# Patient Record
Sex: Male | Born: 1960 | Smoking: Never smoker
Health system: Southern US, Community
[De-identification: ages and names within clinical notes are randomized; demographics above are authoritative.]

## PROBLEM LIST (undated history)

## (undated) DIAGNOSIS — M1990 Unspecified osteoarthritis, unspecified site: Secondary | ICD-10-CM

## (undated) DIAGNOSIS — R079 Chest pain, unspecified: Secondary | ICD-10-CM

## (undated) DIAGNOSIS — K529 Noninfective gastroenteritis and colitis, unspecified: Secondary | ICD-10-CM

## (undated) DIAGNOSIS — M51369 Other intervertebral disc degeneration, lumbar region without mention of lumbar back pain or lower extremity pain: Secondary | ICD-10-CM

## (undated) DIAGNOSIS — M5126 Other intervertebral disc displacement, lumbar region: Secondary | ICD-10-CM

## (undated) DIAGNOSIS — M5136 Other intervertebral disc degeneration, lumbar region: Secondary | ICD-10-CM

## (undated) HISTORY — PX: OTHER SURGICAL HISTORY: SHX169

## (undated) HISTORY — PX: SHOULDER ARTHROSCOPY: SHX128

---

## 2014-06-22 ENCOUNTER — Other Ambulatory Visit (HOSPITAL_COMMUNITY): Payer: Self-pay | Admitting: Orthopaedic Surgery

## 2014-06-30 NOTE — Patient Instructions (Addendum)
YOUR PROCEDURE IS SCHEDULED ON :  07/07/14  REPORT TO [facility] MAIN ENTRANCE FOLLOW SIGNS TO EAST ELEVATOR - GO TO 3rd FLOOR CHECK IN AT 3 EAST NURSES STATION (SHORT STAY) AT:  7:45 AM  CALL THIS NUMBER IF YOU HAVE PROBLEMS THE MORNING OF SURGERY [phone]  REMEMBER:ONLY 1 PER PERSON MAY GO TO SHORT STAY WITH YOU TO GET READY THE MORNING OF YOUR SURGERY  DO NOT EAT FOOD OR DRINK LIQUIDS AFTER MIDNIGHT  TAKE THESE MEDICINES THE MORNING OF SURGERY: NONE  YOU MAY NOT HAVE ANY METAL ON YOUR BODY INCLUDING HAIR PINS AND PIERCING'S. DO NOT WEAR JEWELRY, MAKEUP, LOTIONS, POWDERS OR PERFUMES. DO NOT WEAR NAIL POLISH. DO NOT SHAVE 48 HRS PRIOR TO SURGERY. MEN MAY SHAVE FACE AND NECK.  DO NOT BRING VALUABLES TO HOSPITAL. [facility] IS NOT RESPONSIBLE FOR VALUABLES.  CONTACTS, DENTURES OR PARTIALS MAY NOT BE WORN TO SURGERY. LEAVE SUITCASE IN CAR. CAN BE BROUGHT TO ROOM AFTER SURGERY.  PATIENTS DISCHARGED THE DAY OF SURGERY WILL NOT BE ALLOWED TO DRIVE HOME.  PLEASE READ OVER THE FOLLOWING INSTRUCTION SHEETS _________________________________________________________________________________                                          [facility] - PREPARING FOR SURGERY  Before surgery, you can play an important role.  Because skin is not sterile, your skin needs to be as free of germs as possible.  You can reduce the number of germs on your skin by washing with CHG (chlorahexidine gluconate) soap before surgery.  CHG is an antiseptic cleaner which kills germs and bonds with the skin to continue killing germs even after washing. Please DO NOT use if you have an allergy to CHG or antibacterial soaps.  If your skin becomes reddened/irritated stop using the CHG and inform your nurse when you arrive at Short Stay. Do not shave (including legs and underarms) for at least 48 hours prior to the first CHG shower.  You may shave your face. Please follow these instructions  carefully:   1.  Shower with CHG Soap the night before surgery and the  morning of Surgery.   2.  If you choose to wash your hair, wash your hair first as usual with your  normal  Shampoo.   3.  After you shampoo, rinse your hair and body thoroughly to remove the  shampoo.                                         4.  Use CHG as you would any other liquid soap.  You can apply chg directly  to the skin and wash . Gently wash with scrungie or clean wascloth    5.  Apply the CHG Soap to your body ONLY FROM THE NECK DOWN.   Do not use on open                           Wound or open sores. Avoid contact with eyes, ears mouth and genitals (private parts).                        Genitals (private parts) with your normal soap.  6.  Wash thoroughly, paying special attention to the area where your surgery  will be performed.   7.  Thoroughly rinse your body with warm water from the neck down.   8.  DO NOT shower/wash with your normal soap after using and rinsing off  the CHG Soap .                9.  Pat yourself dry with a clean towel.             10.  Wear clean night clothes to bed after shower             11.  Place clean sheets on your bed the night of your first shower and do not  sleep with pets.  Day of Surgery : Do not apply any lotions/deodorants the morning of surgery.  Please wear clean clothes to the hospital/surgery center.  FAILURE TO FOLLOW THESE INSTRUCTIONS MAY RESULT IN THE CANCELLATION OF YOUR SURGERY    PATIENT SIGNATURE_________________________________  ______________________________________________________________________

## 2014-07-03 ENCOUNTER — Encounter (HOSPITAL_COMMUNITY)
Admission: RE | Admit: 2014-07-03 | Discharge: 2014-07-03 | Disposition: A | Discharge status: Home / Self Care | Payer: BLUE CROSS/BLUE SHIELD | Source: Ambulatory Visit | Attending: Orthopaedic Surgery | Admitting: Orthopaedic Surgery

## 2014-07-03 ENCOUNTER — Encounter (HOSPITAL_COMMUNITY): Payer: Self-pay

## 2014-07-03 DIAGNOSIS — R079 Chest pain, unspecified: Secondary | ICD-10-CM | POA: Insufficient documentation

## 2014-07-03 DIAGNOSIS — M1612 Unilateral primary osteoarthritis, left hip: Secondary | ICD-10-CM | POA: Insufficient documentation

## 2014-07-03 DIAGNOSIS — Z01810 Encounter for preprocedural cardiovascular examination: Secondary | ICD-10-CM | POA: Insufficient documentation

## 2014-07-03 HISTORY — DX: Other intervertebral disc displacement, lumbar region: M51.26

## 2014-07-03 HISTORY — DX: Unspecified osteoarthritis, unspecified site: M19.90

## 2014-07-03 HISTORY — DX: Other intervertebral disc degeneration, lumbar region without mention of lumbar back pain or lower extremity pain: M51.369

## 2014-07-03 HISTORY — DX: Other intervertebral disc degeneration, lumbar region: M51.36

## 2014-07-03 HISTORY — DX: Noninfective gastroenteritis and colitis, unspecified: K52.9

## 2014-07-03 HISTORY — DX: Chest pain, unspecified: R07.9

## 2014-07-03 LAB — BASIC METABOLIC PANEL
ANION GAP: 9 (ref 5–15)
BUN: 26 mg/dL — AB (ref 6–20)
CO2: 24 mmol/L (ref 22–32)
Calcium: 9.3 mg/dL (ref 8.9–10.3)
Chloride: 106 mmol/L (ref 101–111)
Creatinine, Ser: 1 mg/dL (ref 0.61–1.24)
Glucose, Bld: 93 mg/dL (ref 65–99)
POTASSIUM: 4.2 mmol/L (ref 3.5–5.1)
Sodium: 139 mmol/L (ref 135–145)

## 2014-07-03 LAB — SURGICAL PCR SCREEN
MRSA, PCR: NEGATIVE
STAPHYLOCOCCUS AUREUS: NEGATIVE

## 2014-07-03 LAB — CBC
HEMATOCRIT: 45.8 % (ref 39.0–52.0)
HEMOGLOBIN: 15 g/dL (ref 13.0–17.0)
MCH: 28.2 pg (ref 26.0–34.0)
MCHC: 32.8 g/dL (ref 30.0–36.0)
MCV: 86.1 fL (ref 78.0–100.0)
Platelets: 201 10*3/uL (ref 150–400)
RBC: 5.32 MIL/uL (ref 4.22–5.81)
RDW: 13.4 % (ref 11.5–15.5)
WBC: 9.4 10*3/uL (ref 4.0–10.5)

## 2014-07-03 LAB — PROTIME-INR
INR: 1.05 (ref 0.00–1.49)
PROTHROMBIN TIME: 13.9 s (ref 11.6–15.2)

## 2014-07-03 LAB — APTT: aPTT: 31 seconds (ref 24–37)

## 2014-07-03 NOTE — Progress Notes (Signed)
Due to pt concerns about recent intermittent chest pains in past 2 weeks an EKG was done and reviewed by Dr. Driscilla MoatsSinger-nephrologist- no further action was needed at this time - EKG was normal - pt was very vague about sx - denied nausea, sweats, dizziness, unsure of any radiation. Advised pt that EKG looked normal to Dr.Singer but I told pt if problems persisted to see a physician for further evaluation.

## 2014-07-03 NOTE — Progress Notes (Signed)
   07/03/14 1416  OBSTRUCTIVE SLEEP APNEA  Have you ever been diagnosed with sleep apnea through a sleep study? No  Do you snore loudly (loud enough to be heard through closed doors)?  1  Do you often feel tired, fatigued, or sleepy during the daytime? 1  Has anyone observed you stop breathing during your sleep? 0  Do you have, or are you being treated for high blood pressure? 0  BMI more than 35 kg/m2? 1  Age over 53 years old? 1  Neck circumference greater than 40 cm/16 inches? 1  Gender: 1

## 2014-07-06 MED ORDER — DEXTROSE 5 % IV SOLN
3.0000 g | INTRAVENOUS | Status: AC
  Administered 2014-07-07: 3 g via INTRAVENOUS
  Filled 2014-07-06: qty 3000

## 2014-07-07 ENCOUNTER — Inpatient Hospital Stay (HOSPITAL_COMMUNITY)
Admission: RE | Admit: 2014-07-07 | Discharge: 2014-07-08 | DRG: 470 | Disposition: A | Discharge status: Home Health | Payer: BLUE CROSS/BLUE SHIELD | Source: Ambulatory Visit | Attending: Orthopaedic Surgery | Admitting: Orthopaedic Surgery

## 2014-07-07 ENCOUNTER — Inpatient Hospital Stay (HOSPITAL_COMMUNITY): Payer: BLUE CROSS/BLUE SHIELD

## 2014-07-07 ENCOUNTER — Inpatient Hospital Stay (HOSPITAL_COMMUNITY): Payer: BLUE CROSS/BLUE SHIELD | Admitting: Registered Nurse

## 2014-07-07 ENCOUNTER — Encounter (HOSPITAL_COMMUNITY): Payer: Self-pay | Admitting: *Deleted

## 2014-07-07 ENCOUNTER — Encounter (HOSPITAL_COMMUNITY)
Admission: RE | Disposition: A | Discharge status: Home Health | Payer: Self-pay | Source: Ambulatory Visit | Attending: Orthopaedic Surgery

## 2014-07-07 DIAGNOSIS — M1612 Unilateral primary osteoarthritis, left hip: Secondary | ICD-10-CM | POA: Diagnosis present

## 2014-07-07 DIAGNOSIS — M25552 Pain in left hip: Secondary | ICD-10-CM | POA: Diagnosis present

## 2014-07-07 DIAGNOSIS — D62 Acute posthemorrhagic anemia: Secondary | ICD-10-CM | POA: Diagnosis not present

## 2014-07-07 DIAGNOSIS — Z419 Encounter for procedure for purposes other than remedying health state, unspecified: Secondary | ICD-10-CM

## 2014-07-07 DIAGNOSIS — Z96642 Presence of left artificial hip joint: Secondary | ICD-10-CM

## 2014-07-07 DIAGNOSIS — Z01812 Encounter for preprocedural laboratory examination: Secondary | ICD-10-CM

## 2014-07-07 HISTORY — PX: TOTAL HIP ARTHROPLASTY: SHX124

## 2014-07-07 LAB — TYPE AND SCREEN
ABO/RH(D): O POS
Antibody Screen: NEGATIVE

## 2014-07-07 LAB — ABO/RH: ABO/RH(D): O POS

## 2014-07-07 SURGERY — ARTHROPLASTY, HIP, TOTAL, ANTERIOR APPROACH
Anesthesia: Spinal | Site: Hip | Laterality: Left

## 2014-07-07 MED ORDER — PHENYLEPHRINE HCL 10 MG/ML IJ SOLN
10.0000 mg | INTRAVENOUS | Status: DC | PRN
  Administered 2014-07-07: 30 ug/min via INTRAVENOUS

## 2014-07-07 MED ORDER — FENTANYL CITRATE (PF) 100 MCG/2ML IJ SOLN
INTRAMUSCULAR | Status: AC
  Filled 2014-07-07: qty 2

## 2014-07-07 MED ORDER — MIDAZOLAM HCL 2 MG/2ML IJ SOLN
INTRAMUSCULAR | Status: AC
  Filled 2014-07-07: qty 2

## 2014-07-07 MED ORDER — LACTATED RINGERS IV SOLN: INTRAVENOUS | Status: DC

## 2014-07-07 MED ORDER — PROMETHAZINE HCL 25 MG/ML IJ SOLN: 6.2500 mg | INTRAMUSCULAR | Status: DC | PRN

## 2014-07-07 MED ORDER — PROPOFOL 10 MG/ML IV BOLUS
INTRAVENOUS | Status: AC
  Filled 2014-07-07: qty 20

## 2014-07-07 MED ORDER — PROPOFOL 10 MG/ML IV BOLUS
INTRAVENOUS | Status: AC
Start: 2014-07-07 — End: 2014-07-07
  Filled 2014-07-07: qty 20

## 2014-07-07 MED ORDER — DEXAMETHASONE SODIUM PHOSPHATE 10 MG/ML IJ SOLN
INTRAMUSCULAR | Status: AC
  Filled 2014-07-07: qty 1

## 2014-07-07 MED ORDER — ACETAMINOPHEN 650 MG RE SUPP: 650.0000 mg | Freq: Four times a day (QID) | RECTAL | Status: DC | PRN

## 2014-07-07 MED ORDER — HYDROMORPHONE HCL 1 MG/ML IJ SOLN
1.0000 mg | INTRAMUSCULAR | Status: DC | PRN
  Administered 2014-07-07 – 2014-07-08 (×2): 1 mg via INTRAVENOUS
  Filled 2014-07-07 (×2): qty 1

## 2014-07-07 MED ORDER — BUPIVACAINE HCL (PF) 0.5 % IJ SOLN
INTRAMUSCULAR | Status: AC
  Filled 2014-07-07: qty 30

## 2014-07-07 MED ORDER — ACETAMINOPHEN 10 MG/ML IV SOLN
1000.0000 mg | Freq: Once | INTRAVENOUS | Status: AC
  Administered 2014-07-07: 1000 mg via INTRAVENOUS

## 2014-07-07 MED ORDER — METOCLOPRAMIDE HCL 5 MG/ML IJ SOLN
5.0000 mg | Freq: Three times a day (TID) | INTRAMUSCULAR | Status: DC | PRN
Start: 2014-07-07 — End: 2014-07-08

## 2014-07-07 MED ORDER — PROPOFOL INFUSION 10 MG/ML OPTIME
INTRAVENOUS | Status: DC | PRN
  Administered 2014-07-07: 75 ug/kg/min via INTRAVENOUS

## 2014-07-07 MED ORDER — DEXAMETHASONE SODIUM PHOSPHATE 10 MG/ML IJ SOLN
INTRAMUSCULAR | Status: DC | PRN
  Administered 2014-07-07: 10 mg via INTRAVENOUS

## 2014-07-07 MED ORDER — MENTHOL 3 MG MT LOZG: 1.0000 | LOZENGE | OROMUCOSAL | Status: DC | PRN

## 2014-07-07 MED ORDER — PHENOL 1.4 % MT LIQD
1.0000 | OROMUCOSAL | Status: DC | PRN
  Filled 2014-07-07: qty 177

## 2014-07-07 MED ORDER — MIDAZOLAM HCL 5 MG/5ML IJ SOLN
INTRAMUSCULAR | Status: DC | PRN
  Administered 2014-07-07: 1 mg via INTRAVENOUS
  Administered 2014-07-07: 2 mg via INTRAVENOUS

## 2014-07-07 MED ORDER — ZOLPIDEM TARTRATE 5 MG PO TABS: 5.0000 mg | ORAL_TABLET | Freq: Every evening | ORAL | Status: DC | PRN

## 2014-07-07 MED ORDER — PHENYLEPHRINE HCL 10 MG/ML IJ SOLN
INTRAMUSCULAR | Status: AC
  Filled 2014-07-07: qty 1

## 2014-07-07 MED ORDER — ONDANSETRON HCL 4 MG/2ML IJ SOLN
INTRAMUSCULAR | Status: DC | PRN
  Administered 2014-07-07: 4 mg via INTRAVENOUS

## 2014-07-07 MED ORDER — PHENYLEPHRINE HCL 10 MG/ML IJ SOLN
INTRAMUSCULAR | Status: DC | PRN
  Administered 2014-07-07 (×2): 160 ug via INTRAVENOUS

## 2014-07-07 MED ORDER — ACETAMINOPHEN 10 MG/ML IV SOLN
INTRAVENOUS | Status: AC
  Filled 2014-07-07: qty 100

## 2014-07-07 MED ORDER — SODIUM CHLORIDE 0.9 % IR SOLN
Status: DC | PRN
  Administered 2014-07-07: 1000 mL

## 2014-07-07 MED ORDER — LACTATED RINGERS IV SOLN
INTRAVENOUS | Status: DC
  Administered 2014-07-07: 1000 mL via INTRAVENOUS

## 2014-07-07 MED ORDER — METOCLOPRAMIDE HCL 10 MG PO TABS: 5.0000 mg | ORAL_TABLET | Freq: Three times a day (TID) | ORAL | Status: DC | PRN

## 2014-07-07 MED ORDER — ONDANSETRON HCL 4 MG/2ML IJ SOLN
INTRAMUSCULAR | Status: AC
  Filled 2014-07-07: qty 2

## 2014-07-07 MED ORDER — FENTANYL CITRATE (PF) 100 MCG/2ML IJ SOLN
INTRAMUSCULAR | Status: DC | PRN
  Administered 2014-07-07: 100 ug via INTRAVENOUS

## 2014-07-07 MED ORDER — HYDROMORPHONE HCL 1 MG/ML IJ SOLN: 0.2500 mg | INTRAMUSCULAR | Status: DC | PRN

## 2014-07-07 MED ORDER — PROPOFOL 10 MG/ML IV BOLUS
INTRAVENOUS | Status: DC | PRN
  Administered 2014-07-07: 20 mg via INTRAVENOUS
  Administered 2014-07-07: 60 mg via INTRAVENOUS

## 2014-07-07 MED ORDER — DOCUSATE SODIUM 100 MG PO CAPS
100.0000 mg | ORAL_CAPSULE | Freq: Two times a day (BID) | ORAL | Status: DC
  Administered 2014-07-07 – 2014-07-08 (×2): 100 mg via ORAL

## 2014-07-07 MED ORDER — OXYCODONE HCL 5 MG PO TABS
5.0000 mg | ORAL_TABLET | ORAL | Status: DC | PRN
  Administered 2014-07-07 (×2): 5 mg via ORAL
  Filled 2014-07-07 (×2): qty 1

## 2014-07-07 MED ORDER — METHOCARBAMOL 1000 MG/10ML IJ SOLN
500.0000 mg | Freq: Four times a day (QID) | INTRAVENOUS | Status: DC | PRN
  Administered 2014-07-07: 500 mg via INTRAVENOUS
  Filled 2014-07-07 (×2): qty 5

## 2014-07-07 MED ORDER — ASPIRIN EC 325 MG PO TBEC
325.0000 mg | DELAYED_RELEASE_TABLET | Freq: Two times a day (BID) | ORAL | Status: DC
  Administered 2014-07-07 – 2014-07-08 (×2): 325 mg via ORAL
  Filled 2014-07-07 (×4): qty 1

## 2014-07-07 MED ORDER — SODIUM CHLORIDE 0.9 % IV SOLN
INTRAVENOUS | Status: DC
  Administered 2014-07-07: 15:00:00 via INTRAVENOUS

## 2014-07-07 MED ORDER — PHENYLEPHRINE 40 MCG/ML (10ML) SYRINGE FOR IV PUSH (FOR BLOOD PRESSURE SUPPORT)
PREFILLED_SYRINGE | INTRAVENOUS | Status: AC
  Filled 2014-07-07: qty 10

## 2014-07-07 MED ORDER — MEPERIDINE HCL 50 MG/ML IJ SOLN: 6.2500 mg | INTRAMUSCULAR | Status: DC | PRN

## 2014-07-07 MED ORDER — OXYCODONE HCL 5 MG PO TABS
5.0000 mg | ORAL_TABLET | ORAL | Status: DC | PRN
  Administered 2014-07-07 – 2014-07-08 (×4): 10 mg via ORAL
  Filled 2014-07-07 (×4): qty 2

## 2014-07-07 MED ORDER — 0.9 % SODIUM CHLORIDE (POUR BTL) OPTIME
TOPICAL | Status: DC | PRN
  Administered 2014-07-07: 1000 mL

## 2014-07-07 MED ORDER — LACTATED RINGERS IV SOLN
INTRAVENOUS | Status: DC | PRN
  Administered 2014-07-07 (×3): via INTRAVENOUS

## 2014-07-07 MED ORDER — ACETAMINOPHEN 325 MG PO TABS
650.0000 mg | ORAL_TABLET | Freq: Four times a day (QID) | ORAL | Status: DC | PRN
  Administered 2014-07-08: 650 mg via ORAL
  Filled 2014-07-07: qty 2

## 2014-07-07 MED ORDER — FENTANYL CITRATE (PF) 100 MCG/2ML IJ SOLN
25.0000 ug | INTRAMUSCULAR | Status: DC | PRN
  Administered 2014-07-07 (×3): 50 ug via INTRAVENOUS

## 2014-07-07 MED ORDER — ONDANSETRON HCL 4 MG/2ML IJ SOLN: 4.0000 mg | Freq: Four times a day (QID) | INTRAMUSCULAR | Status: DC | PRN

## 2014-07-07 MED ORDER — KETOROLAC TROMETHAMINE 15 MG/ML IJ SOLN
15.0000 mg | Freq: Four times a day (QID) | INTRAMUSCULAR | Status: DC
  Administered 2014-07-07 – 2014-07-08 (×4): 15 mg via INTRAVENOUS
  Filled 2014-07-07 (×7): qty 1

## 2014-07-07 MED ORDER — TRANEXAMIC ACID 1000 MG/10ML IV SOLN
1000.0000 mg | INTRAVENOUS | Status: AC
  Administered 2014-07-07: 1000 mg via INTRAVENOUS
  Filled 2014-07-07: qty 10

## 2014-07-07 MED ORDER — METHOCARBAMOL 500 MG PO TABS
500.0000 mg | ORAL_TABLET | Freq: Four times a day (QID) | ORAL | Status: DC | PRN
Start: 2014-07-07 — End: 2014-07-08
  Administered 2014-07-07 – 2014-07-08 (×3): 500 mg via ORAL
  Filled 2014-07-07 (×3): qty 1

## 2014-07-07 MED ORDER — CEFAZOLIN SODIUM-DEXTROSE 2-3 GM-% IV SOLR
2.0000 g | Freq: Four times a day (QID) | INTRAVENOUS | Status: AC
  Administered 2014-07-07 (×2): 2 g via INTRAVENOUS
  Filled 2014-07-07 (×2): qty 50

## 2014-07-07 MED ORDER — DIPHENHYDRAMINE HCL 12.5 MG/5ML PO ELIX: 12.5000 mg | ORAL_SOLUTION | ORAL | Status: DC | PRN

## 2014-07-07 MED ORDER — ALUM & MAG HYDROXIDE-SIMETH 200-200-20 MG/5ML PO SUSP: 30.0000 mL | ORAL | Status: DC | PRN

## 2014-07-07 MED ORDER — ONDANSETRON HCL 4 MG PO TABS: 4.0000 mg | ORAL_TABLET | Freq: Four times a day (QID) | ORAL | Status: DC | PRN

## 2014-07-07 MED ORDER — BUPIVACAINE HCL (PF) 0.5 % IJ SOLN
INTRAMUSCULAR | Status: DC | PRN
  Administered 2014-07-07: 3 mL

## 2014-07-07 SURGICAL SUPPLY — 43 items
BAG ZIPLOCK 12X15 (MISCELLANEOUS) IMPLANT
BLADE SAW SGTL 18X1.27X75 (BLADE) ×2 IMPLANT
BLADE SAW SGTL 18X1.27X75MM (BLADE) ×1
CAPT HIP TOTAL 2 ×3 IMPLANT
CELLS DAT CNTRL 66122 CELL SVR (MISCELLANEOUS) ×1 IMPLANT
COVER PERINEAL POST (MISCELLANEOUS) ×3 IMPLANT
DRAPE C-ARM 42X120 X-RAY (DRAPES) ×3 IMPLANT
DRAPE STERI IOBAN 125X83 (DRAPES) ×3 IMPLANT
DRAPE U-SHAPE 47X51 STRL (DRAPES) ×9 IMPLANT
DRSG AQUACEL AG ADV 3.5X10 (GAUZE/BANDAGES/DRESSINGS) ×3 IMPLANT
DURAPREP 26ML APPLICATOR (WOUND CARE) ×3 IMPLANT
ELECT BLADE TIP CTD 4 INCH (ELECTRODE) ×3 IMPLANT
ELECT REM PT RETURN 9FT ADLT (ELECTROSURGICAL) ×3
ELECTRODE REM PT RTRN 9FT ADLT (ELECTROSURGICAL) ×1 IMPLANT
FACESHIELD WRAPAROUND (MASK) ×12 IMPLANT
GAUZE XEROFORM 1X8 LF (GAUZE/BANDAGES/DRESSINGS) ×3 IMPLANT
GLOVE BIO SURGEON STRL SZ 6.5 (GLOVE) ×2 IMPLANT
GLOVE BIO SURGEON STRL SZ7.5 (GLOVE) ×6 IMPLANT
GLOVE BIO SURGEONS STRL SZ 6.5 (GLOVE) ×1
GLOVE BIOGEL PI IND STRL 6.5 (GLOVE) ×1 IMPLANT
GLOVE BIOGEL PI IND STRL 7.5 (GLOVE) ×1 IMPLANT
GLOVE BIOGEL PI IND STRL 8 (GLOVE) ×2 IMPLANT
GLOVE BIOGEL PI INDICATOR 6.5 (GLOVE) ×2
GLOVE BIOGEL PI INDICATOR 7.5 (GLOVE) ×2
GLOVE BIOGEL PI INDICATOR 8 (GLOVE) ×4
GLOVE ECLIPSE 8.0 STRL XLNG CF (GLOVE) ×3 IMPLANT
GOWN STRL REUS W/TWL LRG LVL3 (GOWN DISPOSABLE) ×3 IMPLANT
GOWN STRL REUS W/TWL XL LVL3 (GOWN DISPOSABLE) ×9 IMPLANT
HANDPIECE INTERPULSE COAX TIP (DISPOSABLE) ×2
KIT BASIN OR (CUSTOM PROCEDURE TRAY) ×3 IMPLANT
PACK TOTAL JOINT (CUSTOM PROCEDURE TRAY) ×3 IMPLANT
PEN SKIN MARKING BROAD (MISCELLANEOUS) ×3 IMPLANT
RTRCTR WOUND ALEXIS 18CM MED (MISCELLANEOUS) ×3
SET HNDPC FAN SPRY TIP SCT (DISPOSABLE) ×1 IMPLANT
STAPLER VISISTAT 35W (STAPLE) ×3 IMPLANT
SUT ETHIBOND NAB CT1 #1 30IN (SUTURE) ×3 IMPLANT
SUT VIC AB 0 CT1 36 (SUTURE) ×3 IMPLANT
SUT VIC AB 1 CT1 36 (SUTURE) ×3 IMPLANT
SUT VIC AB 2-0 CT1 27 (SUTURE) ×4
SUT VIC AB 2-0 CT1 TAPERPNT 27 (SUTURE) ×2 IMPLANT
TOWEL OR 17X26 10 PK STRL BLUE (TOWEL DISPOSABLE) ×3 IMPLANT
TOWEL OR NON WOVEN STRL DISP B (DISPOSABLE) ×3 IMPLANT
YANKAUER SUCT BULB TIP 10FT TU (MISCELLANEOUS) ×3 IMPLANT

## 2014-07-07 NOTE — Progress Notes (Signed)
Utilization review completed.  

## 2014-07-07 NOTE — Plan of Care (Signed)
Problem: Consults Goal: Diagnosis- Total Joint Replacement Primary Total Hip     

## 2014-07-07 NOTE — Evaluation (Signed)
Physical Therapy Evaluation Patient Details Name: Philip [name] MRN: [id number] DOB: 11/08/1960 Today's Date: 07/07/2014   History of Present Illness  L THR  Clinical Impression  Pt s/p L THR presents with decreased L LE strength/ROM and post op pain limiting functional mobility.  Pt should progress to dc home with family assist and HHPT follow up    Follow Up Recommendations Home health PT    Equipment Recommendations  Rolling walker with 5" wheels    Recommendations for Other Services OT consult     Precautions / Restrictions Precautions Precautions: Fall Restrictions Weight Bearing Restrictions: No Other Position/Activity Restrictions: WBAT      Mobility  Bed Mobility Overal bed mobility: Needs Assistance;+2 for physical assistance Bed Mobility: Supine to Sit;Sit to Supine     Supine to sit: Mod assist;+2 for physical assistance;HOB elevated Sit to supine: Mod assist;+2 for physical assistance   General bed mobility comments: cues for sequence and use of R LE to self assist; assist to manage LEs and to control trunk  Transfers Overall transfer level: Needs assistance Equipment used: Rolling walker (2 wheeled) Transfers: Sit to/from Stand Sit to Stand: Min assist;Mod assist;+2 physical assistance;From elevated surface         General transfer comment: cues for LE management and use of UEs to self assist  Ambulation/Gait Ambulation/Gait assistance: Min assist;Mod assist;+2 safety/equipment Ambulation Distance (Feet): 32 Feet Assistive device: Rolling walker (2 wheeled) Gait Pattern/deviations: Step-to pattern;Decreased step length - right;Decreased step length - left;Shuffle;Trunk flexed Gait velocity: decr   General Gait Details: cues for sequence, posture and position from [employer]  Stairs            Wheelchair Mobility    Modified Rankin (Stroke Patients Only)       Balance                                              Pertinent Vitals/Pain Pain Assessment: 0-10 Pain Score: 5  Pain Location: L hip Pain Descriptors / Indicators: Aching;Burning Pain Intervention(s): Limited activity within patient's tolerance;Monitored during session;Premedicated before session;Ice applied    Home Living Family/patient expects to be discharged to:: Private residence Living Arrangements: Spouse/significant other Available Help at Discharge: Family Type of Home: House Home Access: Stairs to enter Entrance Stairs-Rails: None Secretary/administratorntrance Stairs-Number of Steps: 1 Home Layout: One level Home Equipment: Cane - single point      Prior Function Level of Independence: Independent               Hand Dominance        Extremity/Trunk Assessment   Upper Extremity Assessment: Overall WFL for tasks assessed           Lower Extremity Assessment: LLE deficits/detail      Cervical / Trunk Assessment: Normal  Communication   Communication: No difficulties  Cognition Arousal/Alertness: Awake/alert Behavior During Therapy: WFL for tasks assessed/performed Overall Cognitive Status: Within Functional Limits for tasks assessed                      General Comments      Exercises Total Joint Exercises Ankle Circles/Pumps: AROM;Both;15 reps;Supine      Assessment/Plan    PT Assessment Patient needs continued PT services  PT Diagnosis Difficulty walking   PT Problem List Decreased strength;Decreased range of motion;Decreased activity tolerance;Decreased mobility;Decreased knowledge of use  of DME;Obesity;Pain  PT Treatment Interventions DME instruction;Gait training;Stair training;Functional mobility training;Therapeutic activities;Therapeutic exercise;Patient/family education   PT Goals (Current goals can be found in the Care Plan section) Acute Rehab PT Goals Patient Stated Goal: Resume previous lifestyle with decreased pain PT Goal Formulation: With patient Time For Goal Achievement:  07/14/14 Potential to Achieve Goals: Good    Frequency 7X/week   Barriers to discharge        Co-evaluation               End of Session Equipment Utilized During Treatment: Gait belt Activity Tolerance: Patient tolerated treatment well Patient left: in bed;with call bell/phone within reach;with family/visitor present Nurse Communication: Mobility status         Time: 1610-9604 PT Time Calculation (min) (ACUTE ONLY): 34 min   Charges:   PT Evaluation $Initial PT Evaluation Tier I: 1 Procedure PT Treatments $Gait Training: 8-22 mins   PT G Codes:        Philip [name] 07/14/14, 5:32 PM

## 2014-07-07 NOTE — Anesthesia Preprocedure Evaluation (Addendum)
Anesthesia Evaluation  Patient identified by MRN, date of birth, ID band Patient awake    Reviewed: Allergy & Precautions, NPO status , Patient's Chart, lab work & pertinent test results  Airway Mallampati: II  TM Distance: >3 FB Neck ROM: Full    Dental no notable dental hx.    Pulmonary neg pulmonary ROS,  breath sounds clear to auscultation  Pulmonary exam normal       Cardiovascular negative cardio ROS Normal cardiovascular examRhythm:Regular Rate:Normal     Neuro/Psych negative neurological ROS  negative psych ROS   GI/Hepatic negative GI ROS, Neg liver ROS,   Endo/Other  negative endocrine ROS  Renal/GU negative Renal ROS  negative genitourinary   Musculoskeletal negative musculoskeletal ROS (+)   Abdominal   Peds negative pediatric ROS (+)  Hematology negative hematology ROS (+)   Anesthesia Other Findings   Reproductive/Obstetrics negative OB ROS                             Anesthesia Physical Anesthesia Plan  ASA: II  Anesthesia Plan: Spinal   Post-op Pain Management:    Induction:   Airway Management Planned: Simple Face Mask  Additional Equipment:   Intra-op Plan:   Post-operative Plan:   Informed Consent: I have reviewed the patients History and Physical, chart, labs and discussed the procedure including the risks, benefits and alternatives for the proposed anesthesia with the patient or authorized representative who has indicated his/her understanding and acceptance.   Dental advisory given  Plan Discussed with: CRNA  Anesthesia Plan Comments:         Anesthesia Quick Evaluation  

## 2014-07-07 NOTE — Anesthesia Postprocedure Evaluation (Signed)
  Anesthesia Post-op Note  Patient: Philip [name]  Procedure(s) Performed: Procedure(s) (LRB): LEFT TOTAL HIP ARTHROPLASTY ANTERIOR APPROACH (Left)  Patient Location: PACU  Anesthesia Type: Spinal  Level of Consciousness: awake and alert   Airway and Oxygen Therapy: Patient Spontanous Breathing  Post-op Pain: mild  Post-op Assessment: Post-op Vital signs reviewed, Patient's Cardiovascular Status Stable, Respiratory Function Stable, Patent Airway and No signs of Nausea or vomiting  Last Vitals:  Filed Vitals:   07/07/14 1524  BP: 119/71  Pulse: 68  Temp: 36.5 C  Resp: 16    Post-op Vital Signs: stable   Complications: No apparent anesthesia complications

## 2014-07-07 NOTE — Brief Op Note (Signed)
07/07/2014  11:35 AM  PATIENT:  Philip [name]  54 y.o. male  PRE-OPERATIVE DIAGNOSIS:  osteoarthritis left hip  POST-OPERATIVE DIAGNOSIS:  osteoarthritis left hip  PROCEDURE:  Procedure(s): LEFT TOTAL HIP ARTHROPLASTY ANTERIOR APPROACH (Left)  SURGEON:  Surgeon(s) and Role:    * Kathryne Hitchhristopher Y Rubert Frediani, MD - Primary  PHYSICIAN ASSISTANT: Rexene EdisonGil Clark, PA-C  ANESTHESIA:   spinal  EBL:  Total I/O In: 1000 [I.V.:1000] Out: 300 [Blood:300]  BLOOD ADMINISTERED:none  DRAINS: none   LOCAL MEDICATIONS USED:  NONE  SPECIMEN:  No Specimen  DISPOSITION OF SPECIMEN:  N/A  COUNTS:  YES  TOURNIQUET:  * No tourniquets in log *  DICTATION: .Other Dictation: Dictation Number [id number]  PLAN OF CARE: Admit to inpatient   PATIENT DISPOSITION:  PACU - hemodynamically stable.   Delay start of Pharmacological VTE agent (>24hrs) due to surgical blood loss or risk of bleeding: no

## 2014-07-07 NOTE — H&P (Signed)
TOTAL HIP ADMISSION H&P  Patient is admitted for left total hip arthroplasty.  Subjective:  Chief Complaint: left hip pain  HPI: Philip [name], 54 y.o. male, has a history of pain and functional disability in the left hip(s) due to arthritis and patient has failed non-surgical conservative treatments for greater than 12 weeks to include NSAID's and/or analgesics, corticosteriod injections, flexibility and strengthening excercises, weight reduction as appropriate and activity modification.  Onset of symptoms was abrupt starting 1 years ago with rapidlly worsening course since that time.The patient noted no past surgery on the left hip(s).  Patient currently rates pain in the left hip at 9 out of 10 with activity. Patient has night pain, worsening of pain with activity and weight bearing, pain that interfers with activities of daily living and pain with passive range of motion. Patient has evidence of subchondral sclerosis, periarticular osteophytes and joint space narrowing by imaging studies. This condition presents safety issues increasing the risk of falls.  There is no current active infection.  Patient Active Problem List   Diagnosis Date Noted  . Osteoarthritis of left hip 07/07/2014   Past Medical History  Diagnosis Date  . Chest pain on exertion     past 2-3 weeks  . Arthritis   . Chronic diarrhea     several years  . Bulging lumbar disc     Past Surgical History  Procedure Laterality Date  . Fractured arm      as child    No prescriptions prior to admission   No Known Allergies  History  Substance Use Topics  . Smoking status: Never Smoker   . Smokeless tobacco: Not on file  . Alcohol Use: No    No family history on file.   Review of Systems  Musculoskeletal: Positive for back pain and joint pain.  All other systems reviewed and are negative.   Objective:  Physical Exam  Constitutional: He is oriented to person, place, and time. He appears well-developed and  well-nourished.  HENT:  Head: Normocephalic and atraumatic.  Eyes: Pupils are equal, round, and reactive to light.  Neck: Normal range of motion.  Cardiovascular: Normal rate.   Respiratory: Effort normal and breath sounds normal.  GI: Soft. Bowel sounds are normal.  Musculoskeletal:       Left hip: He exhibits decreased range of motion, decreased strength, tenderness and bony tenderness.  Neurological: He is alert and oriented to person, place, and time.  Skin: Skin is warm.  Psychiatric: He has a normal mood and affect.    Vital signs in last 24 hours:    Labs:   There is no height or weight on file to calculate BMI.   Imaging Review Plain radiographs demonstrate severe degenerative joint disease of the left hip(s). The bone quality appears to be good for age and reported activity level.  Assessment/Plan:  End stage arthritis, left hip(s)  The patient history, physical examination, clinical judgement of the provider and imaging studies are consistent with end stage degenerative joint disease of the left hip(s) and total hip arthroplasty is deemed medically necessary. The treatment options including medical management, injection therapy, arthroscopy and arthroplasty were discussed at length. The risks and benefits of total hip arthroplasty were presented and reviewed. The risks due to aseptic loosening, infection, stiffness, dislocation/subluxation,  thromboembolic complications and other imponderables were discussed.  The patient acknowledged the explanation, agreed to proceed with the plan and consent was signed. Patient is being admitted for inpatient treatment for surgery,  pain control, PT, OT, prophylactic antibiotics, VTE prophylaxis, progressive ambulation and ADL's and discharge planning.The patient is planning to be discharged home with home health services

## 2014-07-07 NOTE — Anesthesia Procedure Notes (Signed)
Spinal Patient location during procedure: OR End time: 07/07/2014 10:10 AM Staffing Resident/CRNA: Enrigue Catena E Performed by: anesthesiologist and resident/CRNA  Preanesthetic Checklist Completed: patient identified, site marked, surgical consent, pre-op evaluation, timeout performed, IV checked, risks and benefits discussed and monitors and equipment checked Spinal Block Patient position: sitting Prep: Betadine Patient monitoring: heart rate, continuous pulse ox and blood pressure Approach: right paramedian Location: L2-3 Injection technique: single-shot Needle Needle type: Spinocan and Sprotte  Needle gauge: 24 G Needle length: 9 cm Assessment Sensory level: T4 Additional Notes Expiration date of kit checked and confirmed. Patient tolerated procedure well, without complications. CSF return. Negative heme or paresthesia.

## 2014-07-07 NOTE — Transfer of Care (Signed)
Immediate Anesthesia Transfer of Care Note  Patient: Philip [name]  Procedure(s) Performed: Procedure(s): LEFT TOTAL HIP ARTHROPLASTY ANTERIOR APPROACH (Left)  Patient Location: PACU  Anesthesia Type:Spinal  Level of Consciousness: awake, alert  and patient cooperative  Airway & Oxygen Therapy: Patient Spontanous Breathing and Patient connected to face mask oxygen  Post-op Assessment: Report given to RN and Post -op Vital signs reviewed and stable  Post vital signs: stable  Last Vitals:  Filed Vitals:   07/07/14 0739  BP: 129/79  Pulse: 72  Temp: 36.6 C  Resp: 18    Complications: No apparent anesthesia complications

## 2014-07-08 LAB — BASIC METABOLIC PANEL
Anion gap: 8 (ref 5–15)
BUN: 17 mg/dL (ref 6–20)
CHLORIDE: 100 mmol/L — AB (ref 101–111)
CO2: 28 mmol/L (ref 22–32)
Calcium: 8.9 mg/dL (ref 8.9–10.3)
Creatinine, Ser: 0.7 mg/dL (ref 0.61–1.24)
GFR calc Af Amer: >60 mL/min (ref >60)
GFR calc non Af Amer: >60 mL/min (ref >60)
GLUCOSE: 147 mg/dL — AB (ref 65–99)
Potassium: 5.3 mmol/L — ABNORMAL HIGH (ref 3.5–5.1)
Sodium: 136 mmol/L (ref 135–145)

## 2014-07-08 LAB — CBC
HCT: 43.4 % (ref 39.0–52.0)
Hemoglobin: 14.4 g/dL (ref 13.0–17.0)
MCH: 29.3 pg (ref 26.0–34.0)
MCHC: 33.2 g/dL (ref 30.0–36.0)
MCV: 88.2 fL (ref 78.0–100.0)
Platelets: 187 10*3/uL (ref 150–400)
RBC: 4.92 MIL/uL (ref 4.22–5.81)
RDW: 13.6 % (ref 11.5–15.5)
WBC: 17.3 10*3/uL — ABNORMAL HIGH (ref 4.0–10.5)

## 2014-07-08 MED ORDER — OXYCODONE-ACETAMINOPHEN 5-325 MG PO TABS: 1.0000 | ORAL_TABLET | ORAL | Status: DC | PRN

## 2014-07-08 MED ORDER — ASPIRIN 325 MG PO TBEC: 325.0000 mg | DELAYED_RELEASE_TABLET | Freq: Two times a day (BID) | ORAL | Status: DC

## 2014-07-08 MED ORDER — METHOCARBAMOL 500 MG PO TABS: 500.0000 mg | ORAL_TABLET | Freq: Four times a day (QID) | ORAL | Status: DC | PRN

## 2014-07-08 NOTE — Plan of Care (Signed)
Problem: Consults Goal: Diagnosis- Total Joint Replacement Outcome: Completed/Met Date Met:  07/08/14 Primary Total Hip LEFT, Anterior  Problem: Phase III Progression Outcomes Goal: Anticoagulant follow-up in place Outcome: Not Applicable Date Met:  72/62/03 ASA for VTE, no f/u needed.

## 2014-07-08 NOTE — Progress Notes (Signed)
Physical Therapy Treatment Patient Details Name: Alphonse GuildHerman A Monterrosa MRN: [id number] DOB: 09/24/1960 Today's Date: 07/08/2014    History of Present Illness L THR    PT Comments    Marked improvement in activity tolerance and quality of mobility.  Pt and spouse asking about dc home today.  Follow Up Recommendations  Home health PT     Equipment Recommendations  Rolling walker with 5" wheels    Recommendations for Other Services OT consult     Precautions / Restrictions Precautions Precautions: Fall Restrictions Weight Bearing Restrictions: No Other Position/Activity Restrictions: WBAT    Mobility  Bed Mobility Overal bed mobility: Needs Assistance;+2 for physical assistance Bed Mobility: Supine to Sit     Supine to sit: Min assist     General bed mobility comments: cues for sequence and use of R LE to self assist  Transfers Overall transfer level: Needs assistance Equipment used: Rolling walker (2 wheeled) Transfers: Sit to/from Stand Sit to Stand: Min assist;From elevated surface         General transfer comment: cues for LE management and use of UEs to self assist  Ambulation/Gait Ambulation/Gait assistance: Min assist;Min guard Ambulation Distance (Feet): 123 Feet Assistive device: Rolling walker (2 wheeled) Gait Pattern/deviations: Step-to pattern;Decreased step length - right;Decreased step length - left;Shuffle;Trunk flexed Gait velocity: decr   General Gait Details: cues for sequence, posture and position from [employer]   Stairs            Wheelchair Mobility    Modified Rankin (Stroke Patients Only)       Balance                                    Cognition Arousal/Alertness: Awake/alert Behavior During Therapy: WFL for tasks assessed/performed Overall Cognitive Status: Within Functional Limits for tasks assessed                      Exercises Total Joint Exercises Ankle Circles/Pumps: AROM;Both;15  reps;Supine Quad Sets: AROM;Both;10 reps;Supine Heel Slides: AAROM;Left;20 reps;Supine Hip ABduction/ADduction: AAROM;Left;15 reps;Supine    General Comments        Pertinent Vitals/Pain Pain Assessment: 0-10 Pain Score: 4  Pain Location: L hip Pain Descriptors / Indicators: Aching;Burning Pain Intervention(s): Limited activity within patient's tolerance;Monitored during session;Premedicated before session;Ice applied    Home Living                      Prior Function            PT Goals (current goals can now be found in the care plan section) Acute Rehab PT Goals Patient Stated Goal: Resume previous lifestyle with decreased pain PT Goal Formulation: With patient Time For Goal Achievement: 07/14/14 Potential to Achieve Goals: Good Progress towards PT goals: Progressing toward goals    Frequency  7X/week    PT Plan Current plan remains appropriate    Co-evaluation             End of Session Equipment Utilized During Treatment: Gait belt Activity Tolerance: Patient tolerated treatment well Patient left: in chair;with call bell/phone within reach;with family/visitor present     Time: 2440-10270947-1021 PT Time Calculation (min) (ACUTE ONLY): 34 min  Charges:  $Gait Training: 8-22 mins $Therapeutic Exercise: 8-22 mins                    G Codes:  Aynsley Fleet 07/08/2014, 12:14 PM

## 2014-07-08 NOTE — Op Note (Signed)
NAMEJAKORIAN, MARENGO               ACCOUNT NO.:  [id number]  MEDICAL RECORD NO.:  [id number]  LOCATION:  1607                         FACILITY:  [facility]  PHYSICIAN:  Vanita Panda. Magnus Ivan, M.D.DATE OF BIRTH:  1960-02-20  DATE OF PROCEDURE:  07/07/2014 DATE OF DISCHARGE:                              OPERATIVE REPORT   PREOPERATIVE DIAGNOSES:  Primary osteoarthritis and degenerative joint disease of left hip.  POSTOPERATIVE DIAGNOSES:  Primary osteoarthritis and degenerative joint disease of left hip.  PROCEDURE:  Left total hip arthroplasty through direct anterior approach.  IMPLANTS:  DePuy Sector Gription acetabular component size 56, size 36+ 4 neutral polyethylene liner, size 14 Corail femoral component with standard offset, size 36+ 1.5 ceramic hip ball.  SURGEON:  Vanita Panda. Magnus Ivan, M.D.  ASSISTANT:  Richardean Canal, PA-C.  ANESTHESIA:  Spinal.  ANTIBIOTICS:  2 g of IV Ancef.  BLOOD LOSS:  About 500 mL.  COMPLICATIONS:  None.  INDICATIONS:  Mr. Cedillos is a 54 year old healthy individual with debilitating arthritis that is quite severe involving his left hip.  It has gotten to where it has affected his activities of daily living, his quality of life, and his mobility.  He has tried intra-articular injections in his hip that did help some and has gotten to where he has pain daily.  He understands the surgery, the risks of acute blood loss anemia, nerve and vessel injury, fracture, infection, dislocation, and DVT.  He understands the goals are decreased pain, improved mobility, and overall improved quality of life.  DESCRIPTION OF PROCEDURE:  After informed consent was obtained, appropriate left hip was marked.  He was brought to the operating room and spinal anesthesia was obtained while he was on the stretcher.  He was then laid in a supine position on the stretcher.  Traction boots were placed on both his feet.  Next, he was placed supine on the  HANA fracture table with the perineal post in place and both legs in inline skeletal traction devices, but no traction applied.  His left operative hip was then prepped and draped with DuraPrep and sterile drapes.  A time-out was called, he was identified as correct patient, correct left hip.  We then made an incision inferior and posterior to the anterosuperior iliac spine and carried this obliquely down the leg.  We dissected down the tensor fascia lata muscle and the tensor fascia lata was then divided longitudinally, so we could proceed with a direct anterior approach to the hip.  We identified and cauterized the lateral femoral circumflex vessels and then put a Cobra retractor on the medial and lateral femoral neck.  We opened up the hip capsule in a L-type format finding a moderate joint effusion and periarticular osteophytes. We placed Cobra retractors within the hip capsule and then made our femoral neck cut with an oscillating saw proximal to the lesser trochanter and completed this with an osteotome.  I placed a corkscrew guide in the femoral head, removed the femoral head in its entirety and found it to be devoid of cartilage.  I then placed a bent Hohmann of the medial acetabular rim and cleaned the acetabulum of remnants from acetabular labrum.  We  then began reaming under direct visualization from a size 42 reamer all the way up to size 56, with all reamers under direct visualization and the last 2 reamers under direct fluoroscopy, so we could obtain our depth of reaming, our inclination and anteversion. Once we were pleased with this, we placed the real DePuy Sector Gription acetabular component size 52, and a real 36+ 4 neutral polyethylene liner.  Attention was then turned to the femur.  With the leg externally rotated to 100 degrees, extended, and adducted, we were able to place a Mueller retractor medially and a Hohmann retractor behind the greater trochanter.  We  released the lateral joint capsule and piriformis and I used a box cutting osteotome to enter the femoral canal and a rongeur to lateralize.  We then began broaching from a size 8 broach using the Corail broaching system all the way up to a size 14.  With the 14, we trialed a standard neck and a 36+ 1 hip ball, we brought the leg back up and over and with traction and internal rotation, reduced the pelvis. We were pleased with leg lengths, stability, and offset.  We then dislocated the hip and removed the trial components.  We placed the real Corail femoral component size 14 and the real 36+ 1.5 ceramic hip ball and reduced this in the acetabulum, again it was stable.  We then copiously irrigated the soft tissues with normal saline solution using pulsatile lavage and I was able to get 1 suture in the joint capsule and closed it with #1 Ethibond followed by running #1 Vicryl in the tensor fascia, 0-Vicryl in the deep tissue, 2-0 Vicryl in the subcutaneous tissue, and staples on the skin.  An Aquacel dressing was applied.  He was then taken off the HANA table and taken to the recovery room in stable condition.  All final counts were correct.  There were no complications noted.  Of note, Richardean CanalGilbert Clark, PA-C assisted in the entire case and his assistance was crucial in facilitating all aspects of this case.     Vanita Pandahristopher Y. Magnus IvanBlackman, M.D.     CYB/MEDQ  D:  07/07/2014  T:  07/08/2014  Job:  [id number]

## 2014-07-08 NOTE — Progress Notes (Signed)
   Subjective:  Patient reports pain as mild.    Objective:   VITALS:   Filed Vitals:   07/07/14 1930 07/07/14 2200 07/08/14 0200 07/08/14 0600  BP: 107/55 106/58 94/62 114/53  Pulse: 78 72 58 63  Temp: 98.4 F (36.9 C) 98 F (36.7 C) 98.4 F (36.9 C) 97.9 F (36.6 C)  TempSrc: Oral Oral Oral Oral  Resp: 16 16 16 16   Height:      Weight:      SpO2: 98% 95% 97% 98%    Neurologically intact ABD soft Neurovascular intact Sensation intact distally Intact pulses distally Dorsiflexion/Plantar flexion intact Incision: dressing C/D/I and no drainage No cellulitis present Compartment soft   Lab Results  Component Value Date   WBC 17.3* 07/08/2014   HGB 14.4 07/08/2014   HCT 43.4 07/08/2014   MCV 88.2 07/08/2014   PLT 187 07/08/2014     Assessment/Plan:  1 Day Post-Op   - Expected postop acute blood loss anemia - will monitor for symptoms - Up with PT/OT - DVT ppx - SCDs, ambulation, asa - WBAT left lower extremity - Pain control - Discharge planning  Philip [name], Philip [name] 07/08/2014, 10:36 AM [phone]

## 2014-07-08 NOTE — Plan of Care (Signed)
Problem: Discharge Progression Outcomes Goal: Anticoagulant follow-up in place Outcome: Not Applicable Date Met:  88/32/54 asa

## 2014-07-08 NOTE — Discharge Summary (Signed)
Patient ID: Philip [name] MRN: [id number] DOB/AGE: 53/08/1960 53 y.o.  Admit date: 07/07/2014 Discharge date: 07/08/2014  Admission Diagnoses:  Principal Problem:   Osteoarthritis of left hip Active Problems:   Status post total replacement of left hip   Discharge Diagnoses:  Same  Past Medical History  Diagnosis Date  . Chest pain on exertion     past 2-3 weeks  . Arthritis   . Chronic diarrhea     several years  . Bulging lumbar disc     Surgeries: Procedure(s): LEFT TOTAL HIP ARTHROPLASTY ANTERIOR APPROACH on 07/07/2014   Consultants:    Discharged Condition: Improved  Hospital Course: Philip [name] is an 54 y.o. male who was admitted 07/07/2014 for operative treatment ofOsteoarthritis of left hip. Patient has severe unremitting pain that affects sleep, daily activities, and work/hobbies. After pre-op clearance the patient was taken to the operating room on 07/07/2014 and underwent  Procedure(s): LEFT TOTAL HIP ARTHROPLASTY ANTERIOR APPROACH.    Patient was given perioperative antibiotics: Anti-infectives    Start     Dose/Rate Route Frequency Ordered Stop   07/07/14 1600  ceFAZolin (ANCEF) IVPB 2 g/50 mL premix     2 g 100 mL/hr over 30 Minutes Intravenous Every 6 hours 07/07/14 1327 07/07/14 2335   07/07/14 0600  ceFAZolin (ANCEF) 3 g in dextrose 5 % 50 mL IVPB     3 g 160 mL/hr over 30 Minutes Intravenous On call to O.R. 07/06/14 1304 07/07/14 0955       Patient was given sequential compression devices, early ambulation, and chemoprophylaxis to prevent DVT.  Patient benefited maximally from hospital stay and there were no complications.    Recent vital signs: Patient Vitals for the past 24 hrs:  BP Temp Temp src Pulse Resp SpO2  07/08/14 0600 (!) 114/53 mmHg 97.9 F (36.6 C) Oral 63 16 98 %  07/08/14 0200 94/62 mmHg 98.4 F (36.9 C) Oral (!) 58 16 97 %  07/07/14 2200 (!) 106/58 mmHg 98 F (36.7 C) Oral 72 16 95 %  07/07/14 1930 (!) 107/55 mmHg 98.4 F (36.9  C) Oral 78 16 98 %  07/07/14 1619 (!) 116/94 mmHg 98.5 F (36.9 C) Oral - 16 98 %  07/07/14 1524 119/71 mmHg 97.7 F (36.5 C) Oral 68 16 99 %  07/07/14 1419 112/75 mmHg 97.9 F (36.6 C) Oral 61 14 99 %  07/07/14 1323 112/67 mmHg 98 F (36.7 C) - 69 12 100 %     Recent laboratory studies:  Recent Labs  07/08/14 0445  WBC 17.3*  HGB 14.4  HCT 43.4  PLT 187  NA 136  K 5.3*  CL 100*  CO2 28  BUN 17  CREATININE 0.70  GLUCOSE 147*  CALCIUM 8.9     Discharge Medications:     Medication List    STOP taking these medications        celecoxib 200 MG capsule  Commonly known as:  CELEBREX     ibuprofen 800 MG tablet  Commonly known as:  ADVIL,MOTRIN     loperamide 2 MG tablet  Commonly known as:  IMODIUM A-D      TAKE these medications        aspirin 325 MG EC tablet  Take 1 tablet (325 mg total) by mouth 2 (two) times daily after a meal.     methocarbamol 500 MG tablet  Commonly known as:  ROBAXIN  Take 1 tablet (500 mg total) by mouth every 6 (  six) hours as needed for muscle spasms.     oxyCODONE-acetaminophen 5-325 MG per tablet  Commonly known as:  ROXICET  Take 1-2 tablets by mouth every 4 (four) hours as needed.        Diagnostic Studies: Dg C-arm 61-120 Min-no Report  07/07/2014   CLINICAL DATA: left anterior hip   C-ARM 61-120 MINUTES  Fluoroscopy was utilized by the requesting physician.  No radiographic  interpretation.    Dg Hip Unilat With Pelvis 1v Left  07/07/2014   CLINICAL DATA:  Left hip.  EXAM: LEFT HIP (WITH PELVIS) 1 VIEW; DG C-ARM 61-120 MIN - NRPT [facility]  COMPARISON:  None.  FINDINGS: Multiple AP C-arm images demonstrate the patient has undergone left total hip prosthesis insertion. The components appear in excellent position in the AP projection. No fractures.  IMPRESSION: Left total hip prosthesis inserted. Satisfactory appearance in the AP projection.   Electronically Signed   By: Francene Boyers M.D.   On: 07/07/2014 11:57   Dg Hip Port  Unilat With Pelvis 1v Left  07/07/2014   CLINICAL DATA:  Status post left hip replacement.  EXAM: LEFT HIP (WITH PELVIS) 1 VIEW PORTABLE  COMPARISON:  None.  FINDINGS: Femoral and acetabular components of the total hip arthroplasty are well-seated and aligned. There is no acute fracture or evidence of an operative complication.  IMPRESSION: Well aligned left hip prosthesis.   Electronically Signed   By: Amie Portland M.D.   On: 07/07/2014 12:58    Disposition:  TO HOME      Discharge Instructions    Discharge patient    Complete by:  As directed            Follow-up Information    Follow up with Kathryne Hitch, MD In 2 weeks.   Specialty:  Orthopedic Surgery   Contact information:   [address] [place] [address] [phone]        Signed: Kathryne Hitch 07/08/2014, 1:15 PM

## 2014-07-08 NOTE — Progress Notes (Signed)
Physical Therapy Treatment Patient Details Name: Philip [name] MRN: [id number] DOB: 02-22-1960 Today's Date: 07/08/2014    History of Present Illness L THR    PT Comments    Pt progressing well with mobility and eager for dc home.  Reviewed car transfers and stairs with pt and spouse.  Follow Up Recommendations  Home health PT     Equipment Recommendations  Rolling walker with 5" wheels    Recommendations for Other Services OT consult     Precautions / Restrictions Precautions Precautions: Fall Restrictions Weight Bearing Restrictions: No Other Position/Activity Restrictions: WBAT    Mobility  Bed Mobility Overal bed mobility: Needs Assistance Bed Mobility: Sit to Supine       Sit to supine: Min guard   General bed mobility comments: cues for sequence and use of R LE to self assist  Transfers Overall transfer level: Needs assistance Equipment used: Rolling walker (2 wheeled) Transfers: Sit to/from Stand Sit to Stand: Min guard         General transfer comment: cues for LE management and use of UEs to self assist  Ambulation/Gait Ambulation/Gait assistance: Min guard;Supervision Ambulation Distance (Feet): 150 Feet Assistive device: Rolling walker (2 wheeled) Gait Pattern/deviations: Step-to pattern;Step-through pattern;Decreased step length - right;Decreased step length - left;Shuffle;Trunk flexed Gait velocity: decr   General Gait Details: cues for posture, position from RW and initial sequence   Stairs Stairs: Yes Stairs assistance: Min assist Stair Management: No rails;Step to pattern;Forwards;With walker Number of Stairs: 2 (single step twice) General stair comments: cues for sequence and foot/RW placement  Wheelchair Mobility    Modified Rankin (Stroke Patients Only)       Balance                                    Cognition Arousal/Alertness: Awake/alert Behavior During Therapy: WFL for tasks  assessed/performed Overall Cognitive Status: Within Functional Limits for tasks assessed                      Exercises      General Comments        Pertinent Vitals/Pain Pain Assessment: 0-10 Pain Score: 3  Pain Location: L hip Pain Descriptors / Indicators: Aching;Burning Pain Intervention(s): Limited activity within patient's tolerance;Monitored during session;Premedicated before session;Ice applied    Home Living Family/patient expects to be discharged to:: Private residence Living Arrangements: Spouse/significant other Available Help at Discharge: Family Type of Home: House Home Access: Stairs to enter Entrance Stairs-Rails: None Home Layout: One level Home Equipment: Cane - single point;Grab bars - tub/shower (elevated toilet seat)      Prior Function Level of Independence: Independent          PT Goals (current goals can now be found in the care plan section) Acute Rehab PT Goals Patient Stated Goal: Resume previous lifestyle with decreased pain PT Goal Formulation: With patient Time For Goal Achievement: 07/14/14 Potential to Achieve Goals: Good Progress towards PT goals: Progressing toward goals    Frequency  7X/week    PT Plan Current plan remains appropriate    Co-evaluation             End of Session Equipment Utilized During Treatment: Gait belt Activity Tolerance: Patient tolerated treatment well Patient left: in bed;with call bell/phone within reach;with family/visitor present     Time: 5621-3086 PT Time Calculation (min) (ACUTE ONLY): 27 min  Charges:  $  Gait Training: 8-22 mins $Therapeutic Activity: 8-22 mins                    G Codes:      Anush Wiedeman 07/08/2014, 3:21 PM

## 2014-07-08 NOTE — Care Management Note (Signed)
Case Management Note  Patient Details  Name: Philip [name] MRN: [id number] Date of Birth: 09/07/1960  Subjective/Objective:         L THR           Action/Plan: Home with Home Health  Expected Discharge Date:         07/08/14         Expected Discharge Plan:  Home w Home Health Services  In-House Referral:     Discharge planning Services  CM Consult  Post Acute Care Choice:    Choice offered to:  Patient, Spouse  DME Arranged:  Walker rolling DME Agency:     HH Arranged:  PT HH Agency:  [facility]  Status of Service:  Completed, signed off  Medicare Important Message Given:    Date Medicare IM Given:    Medicare IM give by:    Date Additional Medicare IM Given:    Additional Medicare Important Message give by:     If discussed at Long Length of Stay Meetings, dates discussed:    Additional Comments:  Governor Speckingdekunle, Nevaya Nagele Bimbo, RN 07/08/2014, 3:30 PM

## 2014-07-08 NOTE — Progress Notes (Signed)
Pt discharged from floor via w/c, belongings & family with pt. No changes in assessment. Philip [name], [name]

## 2014-07-08 NOTE — Progress Notes (Signed)
CM spoke with patient and family in room, [facility].  [facility] [phone] to arrange HHPT and Fayrene FearingJames at [phone] to arrange DME : walker

## 2014-07-08 NOTE — Discharge Instructions (Signed)
INSTRUCTIONS AFTER JOINT REPLACEMENT  ° °o Remove items at home which could result in a fall. This includes throw rugs or furniture in walking pathways °o ICE to the affected joint every three hours while awake for 30 minutes at a time, for at least the first 3-5 days, and then as needed for pain and swelling.  Continue to use ice for pain and swelling. You may notice swelling that will progress down to the foot and ankle.  This is normal after surgery.  Elevate your leg when you are not up walking on it.   °o Continue to use the breathing machine you got in the hospital (incentive spirometer) which will help keep your temperature down.  It is common for your temperature to cycle up and down following surgery, especially at night when you are not up moving around and exerting yourself.  The breathing machine keeps your lungs expanded and your temperature down. ° ° °DIET:  As you were doing prior to hospitalization, we recommend a well-balanced diet. ° °DRESSING / WOUND CARE / SHOWERING ° °Keep the surgical dressing until follow up.  The dressing is water proof, so you can shower without any extra covering.  IF THE DRESSING FALLS OFF or the wound gets wet inside, change the dressing with sterile gauze.  Please use good hand washing techniques before changing the dressing.  Do not use any lotions or creams on the incision until instructed by your surgeon.   ° °ACTIVITY ° °o Increase activity slowly as tolerated, but follow the weight bearing instructions below.   °o No driving for 6 weeks or until further direction given by your physician.  You cannot drive while taking narcotics.  °o No lifting or carrying greater than 10 lbs. until further directed by your surgeon. °o Avoid periods of inactivity such as sitting longer than an hour when not asleep. This helps prevent blood clots.  °o You may return to work once you are authorized by your doctor.  ° ° ° °WEIGHT BEARING  ° °Weight bearing as tolerated with assist  device (walker, cane, etc) as directed, use it as long as suggested by your surgeon or therapist, typically at least 4-6 weeks. ° ° °EXERCISES ° °Results after joint replacement surgery are often greatly improved when you follow the exercise, range of motion and muscle strengthening exercises prescribed by your doctor. Safety measures are also important to protect the joint from further injury. Any time any of these exercises cause you to have increased pain or swelling, decrease what you are doing until you are comfortable again and then slowly increase them. If you have problems or questions, call your caregiver or physical therapist for advice.  ° °Rehabilitation is important following a joint replacement. After just a few days of immobilization, the muscles of the leg can become weakened and shrink (atrophy).  These exercises are designed to build up the tone and strength of the thigh and leg muscles and to improve motion. Often times heat used for twenty to thirty minutes before working out will loosen up your tissues and help with improving the range of motion but do not use heat for the first two weeks following surgery (sometimes heat can increase post-operative swelling).  ° °These exercises can be done on a training (exercise) mat, on the floor, on a table or on a bed. Use whatever works the best and is most comfortable for you.    Use music or television while you are exercising so that   the exercises are a pleasant break in your day. This will make your life better with the exercises acting as a break in your routine that you can look forward to.   Perform all exercises about fifteen times, three times per day or as directed.  You should exercise both the operative leg and the other leg as well. ° °Exercises include: °  °• Quad Sets - Tighten up the muscle on the front of the thigh (Quad) and hold for 5-10 seconds.   °• Straight Leg Raises - With your knee straight (if you were given a brace, keep it on),  lift the leg to 60 degrees, hold for 3 seconds, and slowly lower the leg.  Perform this exercise against resistance later as your leg gets stronger.  °• Leg Slides: Lying on your back, slowly slide your foot toward your buttocks, bending your knee up off the floor (only go as far as is comfortable). Then slowly slide your foot back down until your leg is flat on the floor again.  °• Angel Wings: Lying on your back spread your legs to the side as far apart as you can without causing discomfort.  °• Hamstring Strength:  Lying on your back, push your heel against the floor with your leg straight by tightening up the muscles of your buttocks.  Repeat, but this time bend your knee to a comfortable angle, and push your heel against the floor.  You may put a pillow under the heel to make it more comfortable if necessary.  ° °A rehabilitation program following joint replacement surgery can speed recovery and prevent re-injury in the future due to weakened muscles. Contact your doctor or a physical therapist for more information on knee rehabilitation.  ° ° °CONSTIPATION ° °Constipation is defined medically as fewer than three stools per week and severe constipation as less than one stool per week.  Even if you have a regular bowel pattern at home, your normal regimen is likely to be disrupted due to multiple reasons following surgery.  Combination of anesthesia, postoperative narcotics, change in appetite and fluid intake all can affect your bowels.  ° °YOU MUST use at least one of the following options; they are listed in order of increasing strength to get the job done.  They are all available over the counter, and you may need to use some, POSSIBLY even all of these options:   ° °Drink plenty of fluids (prune juice may be helpful) and high fiber foods °Colace 100 mg by mouth twice a day  °Senokot for constipation as directed and as needed Dulcolax (bisacodyl), take with full glass of water  °Miralax (polyethylene glycol)  once or twice a day as needed. ° °If you have tried all these things and are unable to have a bowel movement in the first 3-4 days after surgery call either your surgeon or your primary doctor.   ° °If you experience loose stools or diarrhea, hold the medications until you stool forms back up.  If your symptoms do not get better within 1 week or if they get worse, check with your doctor.  If you experience "the worst abdominal pain ever" or develop nausea or vomiting, please contact the office immediately for further recommendations for treatment. ° ° °ITCHING:  If you experience itching with your medications, try taking only a single pain pill, or even half a pain pill at a time.  You can also use Benadryl over the counter for itching or also to   help with sleep.   TED HOSE STOCKINGS:  Use stockings on both legs until for at least 2 weeks or as directed by physician office. They may be removed at night for sleeping.  MEDICATIONS:  See your medication summary on the After Visit Summary that nursing will review with you.  You may have some home medications which will be placed on hold until you complete the course of blood thinner medication.  It is important for you to complete the blood thinner medication as prescribed.  PRECAUTIONS:  If you experience chest pain or shortness of breath - call 911 immediately for transfer to the hospital emergency department.   If you develop a fever greater that 101 F, purulent drainage from wound, increased redness or drainage from wound, foul odor from the wound/dressing, or calf pain - CONTACT YOUR SURGEON.                                                   FOLLOW-UP APPOINTMENTS:  If you do not already have a post-op appointment, please call the office for an appointment to be seen by your surgeon.  Guidelines for how soon to be seen are listed in your After Visit Summary, but are typically between 1-4 weeks after surgery.  OTHER INSTRUCTIONS:   Knee  Replacement:  Do not place pillow under knee, focus on keeping the knee straight while resting. CPM instructions: 0-90 degrees, 2 hours in the morning, 2 hours in the afternoon, and 2 hours in the evening. Place foam block, curve side up under heel at all times except when in CPM or when walking.  DO NOT modify, tear, cut, or change the foam block in any way.  MAKE SURE YOU:   Understand these instructions.   Get help right away if you are not doing well or get worse.    Thank you for letting us be a part of your medical care team.  It is a privilege we respect greatly.  We hope these instructions will help you stay on track for a fast and full recovery!    DO GET AN OVER-THE-COUNTER STOOL SOFTENER TO TAKE TWICE DAILY AS NEEDED

## 2014-07-08 NOTE — Progress Notes (Signed)
Occupational Therapy Evaluation Patient Details Name: Philip [name] MRN: [id number] DOB: [birth year] Today's Date: 07/08/2014    History of Present Illness L THR   Clinical Impression   Patient presents to OT with decreased ADL independence as expected post-op. All OT education completed and patient has no further OT needs at this time. Will sign off.    Follow Up Recommendations  No OT follow up;Supervision/Assistance - 24 hour    Equipment Recommendations  None recommended by OT    Recommendations for Other Services       Precautions / Restrictions Precautions Precautions: Fall Restrictions Weight Bearing Restrictions: No Other Position/Activity Restrictions: WBAT      Mobility Bed Mobility Overal bed mobility: Needs Assistance;+2 for physical assistance Bed Mobility: Supine to Sit     Supine to sit: Min assist     General bed mobility comments: cues for sequence and use of R LE to self assist  Transfers Overall transfer level: Needs assistance Equipment used: Rolling walker (2 wheeled) Transfers: Sit to/from Stand Sit to Stand: Min assist;From elevated surface         General transfer comment: cues for LE management and use of UEs to self assist    Balance                                            ADL Overall ADL's : Needs assistance/impaired Eating/Feeding: Independent;Sitting   Grooming: Wash/dry hands;Wash/dry face;Oral care;Set up;Sitting;Standing;Supervision/safety   Upper Body Bathing: Set up;Sitting   Lower Body Bathing: Minimal assistance;Sit to/from stand   Upper Body Dressing : Set up;Sitting   Lower Body Dressing: Moderate assistance;Sit to/from stand   Toilet Transfer: Min guard;Ambulation;BSC;RW   Toileting- Architect and Hygiene: Min guard;Sit to/from stand   Tub/ Shower Transfer: Walk-in shower;Min guard;Ambulation;Grab bars;Rolling walker   Functional mobility during ADLs: Min guard General  ADL Comments: Patient presents to OT fully dressed, stating, "I'm hoping to go home today." Educated patient on LB dressing and bathing techniques and they verbalized understanding. Wife will assist as needed. Practiced toilet and walk-in shower transfers and patient educated on safe techniques. All OT education completed.     Vision     Perception     Praxis      Pertinent Vitals/Pain Pain Assessment: 0-10 Pain Score: 4  Pain Location: L hip Pain Descriptors / Indicators: Aching;Sore Pain Intervention(s): Limited activity within patient's tolerance;Monitored during session     Hand Dominance Right   Extremity/Trunk Assessment Upper Extremity Assessment Upper Extremity Assessment: Overall WFL for tasks assessed   Lower Extremity Assessment Lower Extremity Assessment: Defer to PT evaluation   Cervical / Trunk Assessment Cervical / Trunk Assessment: Normal   Communication Communication Communication: No difficulties   Cognition Arousal/Alertness: Awake/alert Behavior During Therapy: WFL for tasks assessed/performed Overall Cognitive Status: Within Functional Limits for tasks assessed                     General Comments       Exercises Exercises: Total Joint     Shoulder Instructions      Home Living Family/patient expects to be discharged to:: Private residence Living Arrangements: Spouse/significant other Available Help at Discharge: Family Type of Home: House Home Access: Stairs to enter Secretary/administrator of Steps: 1 Entrance Stairs-Rails: None Home Layout: One level     Bathroom Shower/Tub: Psychologist, counselling  Bathroom Toilet: Handicapped height Bathroom Accessibility: Yes How Accessible: Accessible via walker Home Equipment: Cane - single point;Grab bars - tub/shower (elevated toilet seat)          Prior Functioning/Environment Level of Independence: Independent             OT Diagnosis: Acute pain   OT Problem List:  Pain;Decreased strength;Decreased range of motion;Decreased activity tolerance   OT Treatment/Interventions:      OT Goals(Current goals can be found in the care plan section) Acute Rehab OT Goals Patient Stated Goal: Resume previous lifestyle with decreased pain OT Goal Formulation: All assessment and education complete, DC therapy  OT Frequency:     Barriers to D/C:            Co-evaluation              End of Session Equipment Utilized During Treatment: Rolling walker Nurse Communication: Mobility status  Activity Tolerance: Patient tolerated treatment well Patient left: in chair;with call bell/phone within reach;with nursing/sitter in room   Time: [id number] OT Time Calculation (min): 17 min Charges:  OT General Charges $OT Visit: 1 Procedure OT Evaluation $Initial OT Evaluation Tier I: 1 Procedure G-Codes:    Matie Dimaano A 07/08/2014, 1:03 PM

## 2014-07-11 ENCOUNTER — Encounter (HOSPITAL_COMMUNITY): Payer: Self-pay | Admitting: Orthopaedic Surgery

## 2016-04-25 ENCOUNTER — Encounter (HOSPITAL_COMMUNITY): Payer: Self-pay | Admitting: Nurse Practitioner

## 2016-04-25 ENCOUNTER — Emergency Department (HOSPITAL_COMMUNITY)
Admission: EM | Admit: 2016-04-25 | Discharge: 2016-04-26 | Disposition: A | Discharge status: Home / Self Care | Payer: BLUE CROSS/BLUE SHIELD | Attending: Emergency Medicine | Admitting: Emergency Medicine

## 2016-04-25 DIAGNOSIS — R52 Pain, unspecified: Secondary | ICD-10-CM

## 2016-04-25 DIAGNOSIS — Z7982 Long term (current) use of aspirin: Secondary | ICD-10-CM | POA: Diagnosis not present

## 2016-04-25 DIAGNOSIS — M791 Myalgia, unspecified site: Secondary | ICD-10-CM

## 2016-04-25 DIAGNOSIS — Z96642 Presence of left artificial hip joint: Secondary | ICD-10-CM | POA: Diagnosis not present

## 2016-04-25 LAB — CBC
HCT: 41.1 % (ref 39.0–52.0)
HEMOGLOBIN: 13.5 g/dL (ref 13.0–17.0)
MCH: 28.2 pg (ref 26.0–34.0)
MCHC: 32.8 g/dL (ref 30.0–36.0)
MCV: 85.8 fL (ref 78.0–100.0)
PLATELETS: 237 10*3/uL (ref 150–400)
RBC: 4.79 MIL/uL (ref 4.22–5.81)
RDW: 13.3 % (ref 11.5–15.5)
WBC: 13.4 10*3/uL — ABNORMAL HIGH (ref 4.0–10.5)

## 2016-04-25 LAB — BASIC METABOLIC PANEL
ANION GAP: 11 (ref 5–15)
BUN: 20 mg/dL (ref 6–20)
CALCIUM: 9.3 mg/dL (ref 8.9–10.3)
CO2: 25 mmol/L (ref 22–32)
CREATININE: 0.88 mg/dL (ref 0.61–1.24)
Chloride: 102 mmol/L (ref 101–111)
GFR calc Af Amer: >60 mL/min (ref >60)
Glucose, Bld: 158 mg/dL — ABNORMAL HIGH (ref 65–99)
Potassium: 4.2 mmol/L (ref 3.5–5.1)
Sodium: 138 mmol/L (ref 135–145)

## 2016-04-25 LAB — CK: CK TOTAL: 84 U/L (ref 49–397)

## 2016-04-25 MED ORDER — DEXAMETHASONE SODIUM PHOSPHATE 10 MG/ML IJ SOLN
10.0000 mg | Freq: Once | INTRAMUSCULAR | Status: AC
  Administered 2016-04-25: 10 mg via INTRAMUSCULAR
  Filled 2016-04-25: qty 1

## 2016-04-25 NOTE — ED Notes (Signed)
Pt's pain is so sever it is interfering with his ADL's. Wife is having to to clean and dress the husband on a daily basis.  Pt is not asking for pain Mx but would like and answer or a referral to someone who can helps figure out why he is hurting all over.  Pt states 800 mg of Ibuprofen helps with the pain but only for a short while.

## 2016-04-25 NOTE — Discharge Instructions (Addendum)
Follow up with specialist.  If you were given medicines take as directed.  If you are on coumadin or contraceptives realize their levels and effectiveness is altered by many different medicines.  If you have any reaction (rash, tongues swelling, other) to the medicines stop taking and see a physician.    If your blood pressure was elevated in the ER make sure you follow up for management with a primary doctor or return for chest pain, shortness of breath or stroke symptoms.  Please follow up as directed and return to the ER or see a physician for new or worsening symptoms.  Thank you. Vitals:   04/25/16 2130 04/25/16 2145 04/25/16 2200 04/25/16 2215  BP: 115/61 115/61 105/65 125/79  Pulse: 65 72 67 73  Resp: Temp:      TempSrc:      SpO2: 95%   98%

## 2016-04-25 NOTE — ED Provider Notes (Signed)
[facility]-EMERGENCY DEPT Provider Note   CSN: [id number] Arrival date & time: 04/25/16  1825     History   Chief Complaint Chief Complaint  Patient presents with  . Generalized Body Aches    HPI Philip [name] is a 56 y.o. male.  Patient presents with worsening body aches and muscle pains for the past few months. Patient been followed by primary doctor and they're frustrated as they have not seen rheumatology it. Patient's now having difficulty with activities of daily living due to worsening pain. No focal neuro deficits. Patient is not on cholesterol medications. No fevers or chills. No recent tick bites. Patient was healthy before he started getting these body aches.      Past Medical History:  Diagnosis Date  . Arthritis   . Bulging lumbar disc   . Chest pain on exertion    past 2-3 weeks  . Chronic diarrhea    several years    Patient Active Problem List   Diagnosis Date Noted  . Osteoarthritis of left hip 07/07/2014  . 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 fractured arm     as child  . TOTAL HIP ARTHROPLASTY Left 07/07/2014   Procedure: LEFT TOTAL HIP ARTHROPLASTY ANTERIOR APPROACH;  Surgeon: Kathryne Hitch, MD;  Location: [facility];  Service: Orthopedics;  Laterality: Left;       Home Medications    Prior to Admission medications   Medication Sig Start Date End Date Taking? Authorizing Provider  aspirin EC 325 MG EC tablet Take 1 tablet (325 mg total) by mouth 2 (two) times daily after a meal. 07/08/14   Kathryne Hitch, MD  methocarbamol (ROBAXIN) 500 MG tablet Take 1 tablet (500 mg total) by mouth every 6 (six) hours as needed for muscle spasms. 07/08/14   Kathryne Hitch, MD  oxyCODONE-acetaminophen (ROXICET) 5-325 MG per tablet Take 1-2 tablets by mouth every 4 (four) hours as needed. 07/08/14   Kathryne Hitch, MD    Family History History reviewed. No  pertinent family history.  Social History Social History  Substance Use Topics  . Smoking status: Never Smoker  . Smokeless tobacco: Never Used  . Alcohol use No     Allergies   Patient has no known allergies.   Review of Systems Review of Systems  Constitutional: Negative for chills and fever.  HENT: Negative for congestion.   Eyes: Negative for visual disturbance.  Respiratory: Negative for shortness of breath.   Cardiovascular: Negative for chest pain.  Gastrointestinal: Negative for abdominal pain and vomiting.  Genitourinary: Negative for dysuria and flank pain.  Musculoskeletal: Positive for arthralgias and back pain. Negative for neck pain and neck stiffness.  Skin: Negative for rash.  Neurological: Negative for light-headedness and headaches.     Physical Exam Updated Vital Signs BP 125/79   Pulse 73   Temp 99 F (37.2 C) (Oral)   Resp 15   SpO2 98%   Physical Exam  Constitutional: He is oriented to person, place, and time. He appears well-developed and well-nourished.  HENT:  Head: Normocephalic and atraumatic.  Eyes: Conjunctivae are normal. Right eye exhibits no discharge. Left eye exhibits no discharge.  Neck: Normal range of motion. Neck supple. No tracheal deviation present.  Cardiovascular: Normal rate and regular rhythm.   Pulmonary/Chest: Effort normal and breath sounds normal.  Abdominal: Soft. He exhibits no distension. There is no tenderness. There is no  guarding.  Musculoskeletal: He exhibits no edema.  Patient has no effusions to the major joints. Patient has no focal tenderness to the muscle groups.  Neurological: He is alert and oriented to person, place, and time.  Skin: Skin is warm. No rash noted.  Psychiatric: He has a normal mood and affect.  Nursing note and vitals reviewed.    ED Treatments / Results  Labs (all labs ordered are listed, but only abnormal results are displayed) Labs Reviewed  BASIC METABOLIC PANEL - Abnormal;  Notable for the following:       Result Value   Glucose, Bld 158 (*)    All other components within normal limits  CBC - Abnormal; Notable for the following:    WBC 13.4 (*)    All other components within normal limits  CK    EKG  EKG Interpretation None       Radiology No results found.  Procedures Procedures (including critical care time)  Medications Ordered in ED Medications  dexamethasone (DECADRON) injection 10 mg (10 mg Intramuscular Given 04/25/16 2231)     Initial Impression / Assessment and Plan / ED Course  I have reviewed the triage vital signs and the nursing notes.  Pertinent labs & imaging results that were available during my care of the patient were reviewed by me and considered in my medical decision making (see chart for details).    Patient presents with worsening symptoms for the past 3 or 4 months. Patient screening blood work done in the ER unremarkable, CK normal. Patient stable for outpatient follow up with rheumatology and primary doctor.  Results and differential diagnosis were discussed with the patient/parent/guardian. Xrays were independently reviewed by myself.  Close follow up outpatient was discussed, comfortable with the plan.   Medications  dexamethasone (DECADRON) injection 10 mg (10 mg Intramuscular Given 04/25/16 2231)    Vitals:   04/25/16 2130 04/25/16 2145 04/25/16 2200 04/25/16 2215  BP: 115/61 115/61 105/65 125/79  Pulse: 65 72 67 73  Resp: Temp:      TempSrc:      SpO2: 95%   98%    Final diagnoses:  Body aches  Myalgia    Final Clinical Impressions(s) / ED Diagnoses   Final diagnoses:  Body aches  Myalgia    New Prescriptions New Prescriptions   No medications on file     [name], MD 04/25/16 2335

## 2016-04-25 NOTE — ED Triage Notes (Signed)
Pt presents with c/o generalized myalgias. His symptoms began several months ago. His symptoms are getting worse since onset. He has been to [facility] and had many diagnostic tests and tried many treatments with no diagnosis or improvement. He has been taking ibuprofen daily with some relief. He came to [facility] today for another opinion and because his pain is so severe.

## 2016-04-26 NOTE — ED Notes (Signed)
Patient verbalized understanding of discharge instructions and denies any further needs or questions at this time. VS stable. Patient ambulatory with steady gait. Escorted to ED entrance.  

## 2019-03-02 ENCOUNTER — Telehealth: Payer: Self-pay | Admitting: Orthopaedic Surgery

## 2019-03-02 ENCOUNTER — Ambulatory Visit: Payer: Self-pay

## 2019-03-02 ENCOUNTER — Ambulatory Visit: Payer: BC Managed Care – PPO | Admitting: Orthopaedic Surgery

## 2019-03-02 ENCOUNTER — Other Ambulatory Visit: Payer: Self-pay

## 2019-03-02 ENCOUNTER — Encounter: Payer: Self-pay | Admitting: Orthopaedic Surgery

## 2019-03-02 VITALS — Ht 72.0 in | Wt 265.0 lb

## 2019-03-02 DIAGNOSIS — M1712 Unilateral primary osteoarthritis, left knee: Secondary | ICD-10-CM | POA: Diagnosis not present

## 2019-03-02 DIAGNOSIS — M1711 Unilateral primary osteoarthritis, right knee: Secondary | ICD-10-CM

## 2019-03-02 DIAGNOSIS — M25561 Pain in right knee: Secondary | ICD-10-CM | POA: Diagnosis not present

## 2019-03-02 DIAGNOSIS — M25562 Pain in left knee: Secondary | ICD-10-CM

## 2019-03-02 DIAGNOSIS — G8929 Other chronic pain: Secondary | ICD-10-CM

## 2019-03-02 MED ORDER — LIDOCAINE HCL 1 % IJ SOLN
3.0000 mL | INTRAMUSCULAR | Status: AC | PRN
  Administered 2019-03-02: 16:00:00 3 mL

## 2019-03-02 MED ORDER — METHYLPREDNISOLONE ACETATE 40 MG/ML IJ SUSP
40.0000 mg | INTRAMUSCULAR | Status: AC | PRN
  Administered 2019-03-02: 16:00:00 40 mg via INTRA_ARTICULAR

## 2019-03-02 NOTE — Telephone Encounter (Signed)
Please advise 

## 2019-03-02 NOTE — Progress Notes (Signed)
Office Visit Note   Patient: Philip [name]           Date of Birth: Sep 29, 1960           MRN: [id number] Visit Date: 03/02/2019              Requested by: Practice, [facility] [address] [place],  [place] [address] PCP: Practice, [facility]   Assessment & Plan: Visit Diagnoses:  1. Chronic pain of both knees   2. Chronic pain of right knee   3. Chronic pain of left knee   4. Unilateral primary osteoarthritis, left knee   5. Unilateral primary osteoarthritis, right knee     Plan: Based on his clinical exam and x-ray findings at some point he may end up needing knee replacement surgery. We should certainly try to stay conservative first and I explained this to him and his wife in detail. I want him to work on quad strengthening exercises. I did provide a steroid injections in both knees today. He is definitely a candidate for hyaluronic acid for both knees. All question concerns were answered and addressed. He did tolerate steroids in both his knees today. We'll see him back in 4 weeks to hopefully place hyaluronic acid in both knees. In the interim he will work on his quad strengthening exercises.  Follow-Up Instructions: Return in about 4 weeks (around 03/30/2019).   Orders:  Orders Placed This Encounter  Procedures  . Large Joint Inj: R knee  . Large Joint Inj: L knee  . XR KNEE 3 VIEW LEFT  . XR KNEE 3 VIEW RIGHT   No orders of the defined types were placed in this encounter.     Procedures: Large Joint Inj: R knee on 03/02/2019 4:12 PM Indications: diagnostic evaluation and pain Details: 22 G 1.5 in needle, superolateral approach  Arthrogram: No  Medications: 3 mL lidocaine 1 %; 40 mg methylPREDNISolone acetate 40 MG/ML Outcome: tolerated well, no immediate complications Procedure, treatment alternatives, risks and benefits explained, specific risks discussed. Consent was given by the patient. Immediately prior to procedure  a time out was called to verify the correct patient, procedure, equipment, support staff and site/side marked as required. Patient was prepped and draped in the usual sterile fashion.   Large Joint Inj: L knee on 03/02/2019 4:12 PM Indications: diagnostic evaluation and pain Details: 22 G 1.5 in needle, superolateral approach  Arthrogram: No  Medications: 3 mL lidocaine 1 %; 40 mg methylPREDNISolone acetate 40 MG/ML Outcome: tolerated well, no immediate complications Procedure, treatment alternatives, risks and benefits explained, specific risks discussed. Consent was given by the patient. Immediately prior to procedure a time out was called to verify the correct patient, procedure, equipment, support staff and site/side marked as required. Patient was prepped and draped in the usual sterile fashion.       Clinical Data: No additional findings.   Subjective: Chief Complaint  Patient presents with  . Left Knee - Pain  . Right Knee - Pain  The patient is someone we have not seen in many years now. We actually replaced his left hip 4 years ago. He comes in with bilateral knee pain with left worse than right. When he first stands to get up his knees are very stiff. He does walk somewhat hunched over. He says he has pain with walking and climbing stairs. He says he does wear a brace on his left knee and get some relief from  that. He said both knees pop significantly. The left again is worse than the right. This pain has been chronic and seems to be getting worse. He has never had any type of intervention on either knee. He is never had any type of injection either. His wife is with him today. He is not a diabetic.  HPI  Review of Systems He currently denies any headache, chest pain, shortness of breath, fever, chills, nausea, vomiting  Objective: Vital Signs: Ht 6' (1.829 m)   Wt 265 lb (120.2 kg)   BMI 35.94 kg/m   Physical Exam He is alert and orient x3 and in no acute  distress Ortho Exam Examination of both knees shows no effusion. Both knees are cool. Both knees lack full extension by just a few degrees. Both knees have significant patellofemoral crepitation and medial joint line tenderness. Both knees have slight varus malalignment but are ligamentously stable. Specialty Comments:  No specialty comments available.  Imaging: XR KNEE 3 VIEW LEFT  Result Date: 03/02/2019 2 views of the left knee show no acute findings. There is significant patellofemoral arthritic changes. The medial lateral compartments are well-maintained but there are significant calcifications along both meniscus suggesting pseudogout.  XR KNEE 3 VIEW RIGHT  Result Date: 03/02/2019 2 views of the right knee show no acute findings. The medial lateral compartments are well-maintained except for significant calcifications around both meniscus. There is significant patellofemoral arthritic changes.    PMFS History: Patient Active Problem List   Diagnosis Date Noted  . Osteoarthritis of left hip 07/07/2014  . Status post total replacement of left hip 07/07/2014   Past Medical History:  Diagnosis Date  . Arthritis   . Bulging lumbar disc   . Chest pain on exertion    past 2-3 weeks  . Chronic diarrhea    several years    No family history on file.  Past Surgical History:  Procedure Laterality Date  . fractured arm     as child  . TOTAL HIP ARTHROPLASTY Left 07/07/2014   Procedure: LEFT TOTAL HIP ARTHROPLASTY ANTERIOR APPROACH;  Surgeon: Mcarthur Rossetti, MD;  Location: [facility];  Service: Orthopedics;  Laterality: Left;   Social History   Occupational History  . Not on file  Tobacco Use  . Smoking status: Never Smoker  . Smokeless tobacco: Never Used  Substance and Sexual Activity  . Alcohol use: No  . Drug use: No  . Sexual activity: Not on file

## 2019-03-02 NOTE — Telephone Encounter (Signed)
Patient's wife Marliss Czar called wanting to come to appt w/patient stated they will be talking about surgery.  Please call his appt @3 :00. Please call asap with an answer.  [phone]

## 2019-03-02 NOTE — Telephone Encounter (Signed)
Wife aware that she can come to appointment with patient

## 2019-03-02 NOTE — Telephone Encounter (Signed)
That will be fine as long as she wears a mask only check her temperature as well and she has no recent history of COVID-19.

## 2019-03-03 ENCOUNTER — Telehealth: Payer: Self-pay

## 2019-03-03 NOTE — Telephone Encounter (Signed)
Bilateral knee gel injections 

## 2019-03-04 ENCOUNTER — Telehealth: Payer: Self-pay

## 2019-03-04 NOTE — Telephone Encounter (Signed)
Submitted for VOB for Durolane-Bilateral knee 

## 2019-03-04 NOTE — Telephone Encounter (Signed)
Approved for Durolane-Bilateral knee Dr. Magnus Ivan 03-30-19 Buy and Bill Covered @ 100% No prior auth required

## 2019-03-16 ENCOUNTER — Ambulatory Visit: Payer: BC Managed Care – PPO | Admitting: Orthopaedic Surgery

## 2019-03-16 ENCOUNTER — Other Ambulatory Visit: Payer: Self-pay

## 2019-03-16 DIAGNOSIS — M1712 Unilateral primary osteoarthritis, left knee: Secondary | ICD-10-CM | POA: Diagnosis not present

## 2019-03-16 DIAGNOSIS — M1711 Unilateral primary osteoarthritis, right knee: Secondary | ICD-10-CM | POA: Insufficient documentation

## 2019-03-16 MED ORDER — SODIUM HYALURONATE 60 MG/3ML IX PRSY
60.0000 mg | PREFILLED_SYRINGE | INTRA_ARTICULAR | Status: AC | PRN
  Administered 2019-03-16: 16:00:00 60 mg via INTRA_ARTICULAR

## 2019-03-16 NOTE — Progress Notes (Signed)
   Procedure Note  Patient: Philip [name]             Date of Birth: Oct 23, 1960           MRN: [id number]             Visit Date: 03/16/2019  Procedures: Visit Diagnoses:  1. Unilateral primary osteoarthritis, left knee   2. Unilateral primary osteoarthritis, right knee     Large Joint Inj: R knee on 03/16/2019 3:55 PM Indications: diagnostic evaluation and pain Details: 22 G 1.5 in needle, superolateral approach  Arthrogram: No  Medications: 60 mg Sodium Hyaluronate 60 MG/3ML Outcome: tolerated well, no immediate complications Procedure, treatment alternatives, risks and benefits explained, specific risks discussed. Consent was given by the patient. Immediately prior to procedure a time out was called to verify the correct patient, procedure, equipment, support staff and site/side marked as required. Patient was prepped and draped in the usual sterile fashion.   Large Joint Inj: L knee on 03/16/2019 3:56 PM Indications: diagnostic evaluation and pain Details: 22 G 1.5 in needle, superolateral approach  Arthrogram: No  Medications: 60 mg Sodium Hyaluronate 60 MG/3ML Outcome: tolerated well, no immediate complications Procedure, treatment alternatives, risks and benefits explained, specific risks discussed. Consent was given by the patient. Immediately prior to procedure a time out was called to verify the correct patient, procedure, equipment, support staff and site/side marked as required. Patient was prepped and draped in the usual sterile fashion.    The patient comes in today for hyaluronic acid in both knees to treat the pain from osteoarthritis.  His left knee hurts much more than his right knee.  He has tried and failed conservative treatment measures including steroid injections.  Today we will place an Durolane injection in both knees.  I explained to him the rationale behind the injection such as this as well as the risk and benefits involved.  He did tolerate these well.   We would like to see him back though in just 2 months to see if this is helped his left knee which is the worst knee for him.  No x-rays are needed at that visit.

## 2019-03-30 ENCOUNTER — Ambulatory Visit: Payer: BC Managed Care – PPO | Admitting: Orthopaedic Surgery

## 2019-05-16 ENCOUNTER — Encounter: Payer: Self-pay | Admitting: Orthopaedic Surgery

## 2019-05-16 ENCOUNTER — Other Ambulatory Visit: Payer: Self-pay

## 2019-05-16 ENCOUNTER — Ambulatory Visit: Payer: BC Managed Care – PPO | Admitting: Orthopaedic Surgery

## 2019-05-16 DIAGNOSIS — M25562 Pain in left knee: Secondary | ICD-10-CM | POA: Diagnosis not present

## 2019-05-16 DIAGNOSIS — G8929 Other chronic pain: Secondary | ICD-10-CM | POA: Diagnosis not present

## 2019-05-16 NOTE — Addendum Note (Signed)
Addended by: Mardene Celeste B on: 05/16/2019 04:51 PM   Modules accepted: Orders

## 2019-05-16 NOTE — Progress Notes (Signed)
Office Visit Note   Patient: Philip [name]           Date of Birth: Oct 25, 1960           MRN: [id number] Visit Date: 05/16/2019              Requested by: Practice, [place] [phone] [place],  [place] [address] PCP: Practice, [place] [facility]   Assessment & Plan: Visit Diagnoses:  1. Chronic pain of left knee     Plan: Due to the fact the patient continues to have pain in the knee despite conservative treatment which is consistent with injections including supplemental injections and has radiographs that show primarily patellofemoral arthritis.  Recommend MRI of the left knee to evaluate the medial lateral compartments for arthritic changes.  Having follow-up after the MRI to go over results and discuss further treatment.  Questions were encouraged and answered  Follow-Up Instructions: Return AFTER MRI.   Orders:  No orders of the defined types were placed in this encounter.  No orders of the defined types were placed in this encounter.     Procedures: No procedures performed   Clinical Data: No additional findings.   Subjective: Chief Complaint  Patient presents with  . Right Knee - Follow-up  . Left Knee - Follow-up    HPI Philip [name] returns today 2 months status post bilateral knee Durolane injections.  He states the right knee is doing well.  He continues to have constant pain in the left knee.  Pains lateral aspect of the knee and also over the patellofemoral region of the knee.  Does wear a brace at work.  It is worse with increased walking.  Describes pain as a stabbing pain in the knee worse underneath the kneecap.  He is having no mechanical symptoms in the knee.  No new injury to the knee.  Radiographs March 02, 2019 showed severe patellofemoral arthritic changes.  Medial lateral compartments appear well-preserved however there was calcification of the meniscus both compartments. Review of Systems See HPI otherwise  negative or noncontributory  Objective: Vital Signs: There were no vitals taken for this visit.  Physical Exam Pulmonary:     Effort: Pulmonary effort is normal.  Neurological:     Mental Status: He is alert and oriented to person, place, and time.  Psychiatric:        Mood and Affect: Mood normal.     Ortho Exam Bilateral knees good range of motion.  Left knee with left hip patellofemoral crepitus with passive range of motion.  No instability with valgus varus stressing left knee.  No abnormal warmth erythema or effusion left knee.  Slight tenderness along medial lateral joint line left knee. Specialty Comments:  No specialty comments available.  Imaging: No results found.   PMFS History: Patient Active Problem List   Diagnosis Date Noted  . Unilateral primary osteoarthritis, right knee 03/16/2019  . Unilateral primary osteoarthritis, left knee 03/16/2019  . Osteoarthritis of left hip 07/07/2014  . Status post total replacement of left hip 07/07/2014   Past Medical History:  Diagnosis Date  . Arthritis   . Bulging lumbar disc   . Chest pain on exertion    past 2-3 weeks  . Chronic diarrhea    several years    History reviewed. No pertinent family history.  Past Surgical History:  Procedure Laterality Date  . fractured arm     as child  . TOTAL HIP  ARTHROPLASTY Left 07/07/2014   Procedure: LEFT TOTAL HIP ARTHROPLASTY ANTERIOR APPROACH;  Surgeon: Kathryne Hitch, MD;  Location: [facility];  Service: Orthopedics;  Laterality: Left;   Social History   Occupational History  . Not on file  Tobacco Use  . Smoking status: Never Smoker  . Smokeless tobacco: Never Used  Substance and Sexual Activity  . Alcohol use: No  . Drug use: No  . Sexual activity: Not on file

## 2019-06-15 ENCOUNTER — Other Ambulatory Visit: Payer: Self-pay

## 2019-06-15 ENCOUNTER — Ambulatory Visit
Admission: RE | Admit: 2019-06-15 | Discharge: 2019-06-15 | Disposition: A | Discharge status: Home / Self Care | Payer: BC Managed Care – PPO | Source: Ambulatory Visit | Attending: Physician Assistant | Admitting: Physician Assistant

## 2019-06-15 DIAGNOSIS — M25562 Pain in left knee: Secondary | ICD-10-CM

## 2019-06-27 ENCOUNTER — Encounter: Payer: Self-pay | Admitting: Orthopaedic Surgery

## 2019-06-27 ENCOUNTER — Ambulatory Visit (INDEPENDENT_AMBULATORY_CARE_PROVIDER_SITE_OTHER): Payer: BC Managed Care – PPO | Admitting: Orthopaedic Surgery

## 2019-06-27 ENCOUNTER — Other Ambulatory Visit: Payer: Self-pay

## 2019-06-27 DIAGNOSIS — M25862 Other specified joint disorders, left knee: Secondary | ICD-10-CM | POA: Diagnosis not present

## 2019-06-27 DIAGNOSIS — M23204 Derangement of unspecified medial meniscus due to old tear or injury, left knee: Secondary | ICD-10-CM

## 2019-06-27 NOTE — Progress Notes (Signed)
HPI: [name] returns today to go over the MRI of his left knee.  He continues to have pain particularly on the kneecap.  He has had cortisone injections and supplemental injections without any real relief.  Denies any mechanical symptoms in the knee.  Pain is a stabbing pain underneath the kneecap area.  MRI is reviewed with patient ,images shown. MRI left knee dated 06/15/19 showed compartment changes of left full range of motion with severe patellofemoral changes.  Medial lateral compartments had some partial-thickness cartilage loss.  Cystic structure felt to be a ganglion cyst measuring 2.4 x 1 cm in Hoffa's fat pad.  Medial meniscal undersurface tear.   Physical exam: Full range of motion left knee.  Patellofemoral crepitus with passive range of motion left knee.  Tenderness over the patella tendon of the left tendon region.  No abnormal warmth erythema or effusion.  Impression: Left knee medial meniscal tear Patellofemoral arthritic changes  Cyst Hoffa's fat pad  Plan: Due to the fact the patient is failed conservative treatment and continues to have pain in the knee recommend knee arthroscopy with debridement partial medial appendicectomy and excision of cyst Hoffa's fat pad.  Questions encouraged and answered at length by Dr. Magnus Ivan and myself.  Risk benefits discussed with patient.  He will follow up with [place] 1 week postop.

## 2019-08-11 ENCOUNTER — Other Ambulatory Visit: Payer: Self-pay | Admitting: Orthopaedic Surgery

## 2019-08-11 ENCOUNTER — Encounter: Payer: Self-pay | Admitting: Orthopaedic Surgery

## 2019-08-11 DIAGNOSIS — M94262 Chondromalacia, left knee: Secondary | ICD-10-CM | POA: Diagnosis not present

## 2019-08-11 MED ORDER — HYDROCODONE-ACETAMINOPHEN 5-325 MG PO TABS: 1.0000 | ORAL_TABLET | Freq: Four times a day (QID) | ORAL | 0 refills | Status: DC | PRN

## 2019-08-12 ENCOUNTER — Telehealth: Payer: Self-pay | Admitting: Orthopaedic Surgery

## 2019-08-12 NOTE — Telephone Encounter (Signed)
They actually put a knee immobilizer on him due to having a femoral nerve block.  He can remove that and does not need the brace at all.

## 2019-08-12 NOTE — Telephone Encounter (Signed)
Patient wife aware of the message below from Dr. Magnus Ivan

## 2019-08-12 NOTE — Telephone Encounter (Signed)
Pts wife Clint Lipps called stating the pt had surgery 08/11/19 and she would like to know if she can take off the metal brace? Lea would like a CB with the answer.  [phone]

## 2019-08-16 ENCOUNTER — Telehealth: Payer: Self-pay | Admitting: Orthopaedic Surgery

## 2019-08-16 NOTE — Telephone Encounter (Signed)
Pt called stating [employer] told him his paperwork was with [place] and he would like a CB with the status  [phone]

## 2019-08-16 NOTE — Telephone Encounter (Signed)
Do you happen to know if you've seen this today or if it's on Tammy's desk?

## 2019-08-17 NOTE — Telephone Encounter (Signed)
I don't see anything.

## 2019-08-18 ENCOUNTER — Encounter: Payer: Self-pay | Admitting: Orthopaedic Surgery

## 2019-08-18 ENCOUNTER — Telehealth: Payer: Self-pay | Admitting: Orthopaedic Surgery

## 2019-08-18 ENCOUNTER — Ambulatory Visit (INDEPENDENT_AMBULATORY_CARE_PROVIDER_SITE_OTHER): Payer: BC Managed Care – PPO | Admitting: Orthopaedic Surgery

## 2019-08-18 DIAGNOSIS — Z9889 Other specified postprocedural states: Secondary | ICD-10-CM

## 2019-08-18 DIAGNOSIS — M23204 Derangement of unspecified medial meniscus due to old tear or injury, left knee: Secondary | ICD-10-CM

## 2019-08-18 NOTE — Telephone Encounter (Signed)
I haven't seen this

## 2019-08-18 NOTE — Progress Notes (Signed)
The patient is 1 week out from a left knee arthroscopy with a partial medial meniscectomy.  We also found significant cartilage wear at the trochlear groove, the undersurface of the patella and the medial compartment the knee.  He does have a large effusion today with his left knee postoperatively.  Both his ankles are swollen.  His calf is soft.  I was able to aspirate at least 50 cc of bloody fluid off of the left knee.  I then placed a steroid into the knee joint itself.  He will continue to ambulate with a cane.  I do not anticipate him being able to return to work till least early September 13.  He is a candidate for hyaluronic acid as well for his left knee to treat the pain from the osteoarthritis.  All question concerns were answered addressed.  He will continue slow increase activities and work on quad strengthening exercises.  I will see him back for return visit in 4 weeks.  He is out of work until at least his next follow-up.

## 2019-08-18 NOTE — Telephone Encounter (Signed)
Pt states he called [employer] to check on his paperwork and they told him it was here waiting to be filled out and faxed back; pt would like a CB to let him know how much longer it will take to finish processing.  [phone]

## 2019-08-19 ENCOUNTER — Telehealth: Payer: Self-pay

## 2019-08-19 NOTE — Telephone Encounter (Signed)
Submitted VOB, SynviscOne, left knee. 

## 2019-08-19 NOTE — Telephone Encounter (Signed)
Ic pt, lmvm advised [employer] faxed his forms back to [facility] yesterday 8/12.

## 2019-08-19 NOTE — Telephone Encounter (Signed)
Noted  

## 2019-08-19 NOTE — Telephone Encounter (Signed)
Left knee gel injection ?

## 2019-08-23 ENCOUNTER — Telehealth: Payer: Self-pay | Admitting: Orthopaedic Surgery

## 2019-08-23 ENCOUNTER — Telehealth: Payer: Self-pay

## 2019-08-23 NOTE — Telephone Encounter (Signed)
Pt would like a CB in regards to his paperwork and getting an extension past 09/11/19.  [phone]

## 2019-08-23 NOTE — Telephone Encounter (Signed)
Received call from patient. Needs duration date on FMLA forms to be changed from 9/5 to 9/20 as his return appt was pushed out. Is this okay to change?

## 2019-08-23 NOTE — Telephone Encounter (Signed)
That will be fine thanks

## 2019-08-23 NOTE — Telephone Encounter (Signed)
Will submit for Durolane, left knee, due to SynviscOne not being covered by insurance.  Submitted VOB, Durolane, left knee.

## 2019-08-24 ENCOUNTER — Telehealth: Payer: Self-pay

## 2019-08-24 NOTE — Telephone Encounter (Signed)
Approved, Durolane, left knee. Buy & Bill Patient is covered at 100% of the allowable. No Co-pay No PA required  Appt. 09/15/2019

## 2019-08-24 NOTE — Telephone Encounter (Signed)
Duration date on [employer] form changed from 08/11/19-09/11/19 to 08/11/19 through 09/26/19. Faxed [phone]

## 2019-08-25 NOTE — Telephone Encounter (Signed)
Tammy helped him with this yesterday

## 2019-09-05 ENCOUNTER — Telehealth: Payer: Self-pay | Admitting: Orthopaedic Surgery

## 2019-09-05 NOTE — Telephone Encounter (Signed)
Pt needs a CB in regards to [facility] paperwork that's due tomorrow.   [phone]

## 2019-09-08 NOTE — Telephone Encounter (Signed)
Received new forms and sent to [employer].

## 2019-09-15 ENCOUNTER — Ambulatory Visit (INDEPENDENT_AMBULATORY_CARE_PROVIDER_SITE_OTHER): Payer: BC Managed Care – PPO | Admitting: Orthopaedic Surgery

## 2019-09-15 ENCOUNTER — Encounter: Payer: Self-pay | Admitting: Orthopaedic Surgery

## 2019-09-15 DIAGNOSIS — M1712 Unilateral primary osteoarthritis, left knee: Secondary | ICD-10-CM | POA: Diagnosis not present

## 2019-09-15 MED ORDER — SODIUM HYALURONATE 60 MG/3ML IX PRSY
60.0000 mg | PREFILLED_SYRINGE | INTRA_ARTICULAR | Status: AC | PRN
  Administered 2019-09-15: 60 mg via INTRA_ARTICULAR

## 2019-09-15 NOTE — Progress Notes (Signed)
   Procedure Note  Patient: Philip [name]             Date of Birth: 11/24/1960           MRN: [id number]             Visit Date: 09/15/2019  Procedures: Visit Diagnoses: No diagnosis found.  Large Joint Inj: L knee on 09/15/2019 1:45 PM Indications: diagnostic evaluation and pain Details: 22 G 1.5 in needle, superolateral approach  Arthrogram: No  Medications: 60 mg Sodium Hyaluronate 60 MG/3ML Outcome: tolerated well, no immediate complications Procedure, treatment alternatives, risks and benefits explained, specific risks discussed. Consent was given by the patient. Immediately prior to procedure a time out was called to verify the correct patient, procedure, equipment, support staff and site/side marked as required. Patient was prepped and draped in the usual sterile fashion.    The patient is here for scheduled hyaluronic acid injection with Durolane in his left knee to treat the pain from osteoarthritis.  Has tried but other conservative treatment measures including steroid injections.  He does have moderate arthritis in that knee.  I explained the rationale behind injections such as this as well as the risk benefits involved.  There is no knee joint effusion.  I then placed injection through the superior lateral aspect which he tolerated well.  All questions and concerns were answered and addressed.  We will keep him out of work and off his knee over the next week.  He can return to work starting September 20.  Follow-up as otherwise as needed.

## 2019-11-16 ENCOUNTER — Ambulatory Visit (INDEPENDENT_AMBULATORY_CARE_PROVIDER_SITE_OTHER): Payer: BC Managed Care – PPO | Admitting: Orthopaedic Surgery

## 2019-11-16 DIAGNOSIS — G8929 Other chronic pain: Secondary | ICD-10-CM

## 2019-11-16 DIAGNOSIS — M25562 Pain in left knee: Secondary | ICD-10-CM | POA: Diagnosis not present

## 2019-11-16 DIAGNOSIS — M1712 Unilateral primary osteoarthritis, left knee: Secondary | ICD-10-CM | POA: Diagnosis not present

## 2019-11-16 DIAGNOSIS — M25561 Pain in right knee: Secondary | ICD-10-CM | POA: Diagnosis not present

## 2019-11-16 MED ORDER — METHYLPREDNISOLONE ACETATE 40 MG/ML IJ SUSP
40.0000 mg | INTRAMUSCULAR | Status: AC | PRN
  Administered 2019-11-16: 40 mg via INTRA_ARTICULAR

## 2019-11-16 MED ORDER — LIDOCAINE HCL 1 % IJ SOLN
3.0000 mL | INTRAMUSCULAR | Status: AC | PRN
  Administered 2019-11-16: 3 mL

## 2019-11-16 NOTE — Progress Notes (Signed)
Office Visit Note   Patient: Philip [name]           Date of Birth: 1960/02/03           MRN: [id number] Visit Date: 11/16/2019              Requested by: Practice, [facility] [address] [place],  [place] [address] PCP: Practice, [facility]   Assessment & Plan: Visit Diagnoses:  1. Chronic pain of left knee   2. Chronic pain of right knee   3. Unilateral primary osteoarthritis, left knee     Plan: We did talk in detail about knee replacement surgery at this point.  I described in detail what the surgery involves and described the risk and benefits of surgery.  I showed him a knee model and explained what the interoperative and postoperative course involves.  We did place a steroid injection in his left knee today to temporize his pain.  We also injected his right knee.  We will work on getting him set up for knee replacement surgery for the left knee.  All question concerns were answered and addressed.  Follow-Up Instructions: Return for 2 weeks post-op.   Orders:  Orders Placed This Encounter  Procedures  . Large Joint Inj   No orders of the defined types were placed in this encounter.     Procedures: Large Joint Inj: L knee on 11/16/2019 4:06 PM Indications: diagnostic evaluation and pain Details: 22 G 1.5 in needle, superolateral approach  Arthrogram: No  Medications: 3 mL lidocaine 1 %; 40 mg methylPREDNISolone acetate 40 MG/ML Outcome: tolerated well, no immediate complications Procedure, treatment alternatives, risks and benefits explained, specific risks discussed. Consent was given by the patient. Immediately prior to procedure a time out was called to verify the correct patient, procedure, equipment, support staff and site/side marked as required. Patient was prepped and draped in the usual sterile fashion.       Clinical Data: No additional findings.   Subjective: Chief Complaint  Patient presents with  .  Left Knee - Pain  The patient is well-known to me.  He has significant arthritis in his left knee.  We have actually replaced his left hip before and has done wonderful.  His left knee still hurts daily.  It is definitely affecting his mobility, his quality of life and his actives daily living.  It has been going on for over a year now.  He has had arthroscopic surgery on the left knee.  We have tried activity modification and anti-inflammatories.  He has had steroid injections and hyaluronic acid injections left knee.  At this point he has tried and failed all forms conservative treatment and is in need of something else.  He still works hard with Philip [name].  He denies any other acute changes in his medical status.  His MRI x-rays and intraoperative scope films show arthritic changes throughout his knee.  He is not a diabetic.  HPI  Review of Systems He currently denies any headache, chest pain, shortness of breath, fever, chills, nausea, vomiting  Objective: Vital Signs: There were no vitals taken for this visit.  Physical Exam He is alert and orient x3 and in no acute distress Ortho Exam Examination of his left knee show significant patellofemoral crepitation.  There is medial lateral joint line tenderness and pain throughout his arc of motion of that knee.  There is slight varus malalignment that is correctable.  His right knee also hurts. Specialty Comments:  No specialty comments available.  Imaging: No results found.   PMFS History: Patient Active Problem List   Diagnosis Date Noted  . Unilateral primary osteoarthritis, right knee 03/16/2019  . Unilateral primary osteoarthritis, left knee 03/16/2019  . Osteoarthritis of left hip 07/07/2014  . Status post total replacement of left hip 07/07/2014   Past Medical History:  Diagnosis Date  . Arthritis   . Bulging lumbar disc   . Chest pain on exertion    past 2-3 weeks  . Chronic diarrhea    several years    No family  history on file.  Past Surgical History:  Procedure Laterality Date  . fractured arm     as child  . TOTAL HIP ARTHROPLASTY Left 07/07/2014   Procedure: LEFT TOTAL HIP ARTHROPLASTY ANTERIOR APPROACH;  Surgeon: Philip Hitch, MD;  Location: [facility];  Service: Orthopedics;  Laterality: Left;   Social History   Occupational History  . Not on file  Tobacco Use  . Smoking status: Never Smoker  . Smokeless tobacco: Never Used  Substance and Sexual Activity  . Alcohol use: No  . Drug use: No  . Sexual activity: Not on file

## 2019-12-21 ENCOUNTER — Other Ambulatory Visit: Payer: Self-pay

## 2019-12-26 ENCOUNTER — Other Ambulatory Visit: Payer: Self-pay | Admitting: Physician Assistant

## 2019-12-27 NOTE — Pre-Procedure Instructions (Signed)
[id number] - [place], [place] - [address] [place] [address] Phone: [phone] Fax: [phone]  [facility], [place] - [address] [place] [address] Phone: [phone] Fax: [phone]      Your procedure is scheduled on Tuesday, December 28th at 12:32p.m.  Report to [facility] Main Entrance "A" at 10:30 A.M., and check in at the Admitting office.  Call this number if you have problems the morning of surgery:  [phone]  Call [phone] if you have any questions prior to your surgery date Monday-Friday 8am-4pm.    Remember:  Do not eat after midnight the night before your surgery  You may drink clear liquids until 9:30 a.m. the morning of your surgery.   Clear liquids allowed are: Water, Non-Citrus Juices (without pulp), Carbonated Beverages, Clear Tea, Black Coffee Only, and Gatorade.   Enhanced Recovery after Surgery for Orthopedics Enhanced Recovery after Surgery is a protocol used to improve the stress on your body and your recovery after surgery.  Patient Instructions  . The night before surgery:  o No food after midnight. ONLY clear liquids after midnight  .  Marland Kitchen The day of surgery (if you do NOT have diabetes):  o Drink ONE (1) Pre-Surgery Clear Ensure by 9:30 am the morning of surgery   o This drink was given to you during your hospital  pre-op appointment visit. o Nothing else to drink after completing the  Pre-Surgery Clear Ensure.         If you have questions, please contact your surgeon's office.     Take these medicines the morning of surgery with A SIP OF WATER  loperamide (IMODIUM A-D)-as needed   As of today, STOP taking any Aspirin (unless otherwise instructed by your surgeon) Aleve, Naproxen, Ibuprofen, Motrin, Advil, Goody's, BC's, all herbal medications, fish oil, and all vitamins.                      Do not wear jewelry.            Do not wear lotions,  powders, colognes, or deodorant.            Men may shave face and neck.            Do not bring valuables to the hospital.            [facility] is not responsible for any belongings or valuables.  Do NOT Smoke (Tobacco/Vaping) or drink Alcohol 24 hours prior to your procedure If you use a CPAP at night, you may bring all equipment for your overnight stay.   Contacts, glasses, dentures or bridgework may not be worn into surgery.      For patients admitted to the hospital, discharge time will be determined by your treatment team.   Patients discharged the day of surgery will not be allowed to drive home, and someone needs to stay with them for 24 hours.    Special instructions:   [facility]- Preparing For Surgery  Before surgery, you can play an important role. Because skin is not sterile, your skin needs to be as free of germs as possible. You can reduce the number of germs on your skin by washing with CHG (chlorahexidine gluconate) Soap before surgery.  CHG is an antiseptic cleaner which kills germs and bonds with the skin to continue killing germs even after washing.    Oral Hygiene is  also important to reduce your risk of infection.  Remember - BRUSH YOUR TEETH THE MORNING OF SURGERY WITH YOUR REGULAR TOOTHPASTE  Please do not use if you have an allergy to CHG or antibacterial soaps. If your skin becomes reddened/irritated stop using the CHG.  Do not shave (including legs and underarms) for at least 48 hours prior to first CHG shower. It is OK to shave your face.  Please follow these instructions carefully.   1. Shower the NIGHT BEFORE SURGERY and the MORNING OF SURGERY with CHG Soap.   2. If you chose to wash your hair, wash your hair first as usual with your normal shampoo.  3. After you shampoo, rinse your hair and body thoroughly to remove the shampoo.  4. Use CHG as you would any other liquid soap. You can apply CHG directly to the skin and wash gently with a scrungie or  a clean washcloth.   5. Apply the CHG Soap to your body ONLY FROM THE NECK DOWN.  Do not use on open wounds or open sores. Avoid contact with your eyes, ears, mouth and genitals (private parts). Wash Face and genitals (private parts)  with your normal soap.   6. Wash thoroughly, paying special attention to the area where your surgery will be performed.  7. Thoroughly rinse your body with warm water from the neck down.  8. DO NOT shower/wash with your normal soap after using and rinsing off the CHG Soap.  9. Pat yourself dry with a CLEAN TOWEL.  10. Wear CLEAN PAJAMAS to bed the night before surgery  11. Place CLEAN SHEETS on your bed the night of your first shower and DO NOT SLEEP WITH PETS.   Day of Surgery: Wear Clean/Comfortable clothing the morning of surgery Do not apply any deodorants/lotions.   Remember to brush your teeth WITH YOUR REGULAR TOOTHPASTE.   Please read over the following fact sheets that you were given.

## 2019-12-28 ENCOUNTER — Other Ambulatory Visit: Payer: Self-pay

## 2019-12-28 ENCOUNTER — Encounter (HOSPITAL_COMMUNITY)
Admission: RE | Admit: 2019-12-28 | Discharge: 2019-12-28 | Disposition: A | Discharge status: Home / Self Care | Payer: BC Managed Care – PPO | Source: Ambulatory Visit | Attending: Orthopaedic Surgery | Admitting: Orthopaedic Surgery

## 2019-12-28 ENCOUNTER — Encounter (HOSPITAL_COMMUNITY): Payer: Self-pay

## 2019-12-28 DIAGNOSIS — Z01812 Encounter for preprocedural laboratory examination: Secondary | ICD-10-CM | POA: Diagnosis not present

## 2019-12-28 LAB — CBC
HCT: 45.6 % (ref 39.0–52.0)
Hemoglobin: 15.3 g/dL (ref 13.0–17.0)
MCH: 30.7 pg (ref 26.0–34.0)
MCHC: 33.6 g/dL (ref 30.0–36.0)
MCV: 91.6 fL (ref 80.0–100.0)
Platelets: 207 10*3/uL (ref 150–400)
RBC: 4.98 MIL/uL (ref 4.22–5.81)
RDW: 13.6 % (ref 11.5–15.5)
WBC: 7.1 10*3/uL (ref 4.0–10.5)
nRBC: 0 % (ref 0.0–0.2)

## 2019-12-28 LAB — TYPE AND SCREEN
ABO/RH(D): O POS
Antibody Screen: NEGATIVE

## 2019-12-28 LAB — SURGICAL PCR SCREEN
MRSA, PCR: NEGATIVE
Staphylococcus aureus: NEGATIVE

## 2019-12-28 NOTE — Progress Notes (Signed)
PCP - [facility] Cardiologist - patient denies  PPM/ICD - n/a Device Orders -  Rep Notified -   Chest x-ray - n/a EKG - n/a Stress Test - patient denies ECHO - patient denies Cardiac Cath - patient denies  Sleep Study - patient denies CPAP -   Fasting Blood Sugar - n/a Checks Blood Sugar _____ times a day  Blood Thinner Instructions: n/a Aspirin Instructions: n/a  ERAS Protcol - clears until 0930 PRE-SURGERY Ensure or G2- Ensure provided to patient, to be completed by 0930  COVID TEST- scheduled for Monday 01/02/20   Anesthesia review: n/a  Patient denies shortness of breath, fever, cough and chest pain at PAT appointment   All instructions explained to the patient, with a verbal understanding of the material. Patient agrees to go over the instructions while at home for a better understanding. Patient also instructed to self quarantine after being tested for COVID-19. The opportunity to ask questions was provided.

## 2020-01-02 ENCOUNTER — Other Ambulatory Visit (HOSPITAL_COMMUNITY)
Admission: RE | Admit: 2020-01-02 | Discharge: 2020-01-02 | Disposition: A | Discharge status: Home / Self Care | Payer: BC Managed Care – PPO | Source: Ambulatory Visit | Attending: Orthopaedic Surgery | Admitting: Orthopaedic Surgery

## 2020-01-02 DIAGNOSIS — Z01812 Encounter for preprocedural laboratory examination: Secondary | ICD-10-CM | POA: Diagnosis not present

## 2020-01-02 DIAGNOSIS — Z20822 Contact with and (suspected) exposure to covid-19: Secondary | ICD-10-CM | POA: Insufficient documentation

## 2020-01-02 LAB — SARS CORONAVIRUS 2 (TAT 6-24 HRS): SARS Coronavirus 2: NEGATIVE

## 2020-01-02 MED ORDER — DEXTROSE 5 % IV SOLN
3.0000 g | INTRAVENOUS | Status: AC
  Administered 2020-01-03: 12:00:00 3 g via INTRAVENOUS
  Filled 2020-01-02: qty 3

## 2020-01-03 ENCOUNTER — Ambulatory Visit (HOSPITAL_COMMUNITY): Payer: BC Managed Care – PPO | Admitting: Anesthesiology

## 2020-01-03 ENCOUNTER — Encounter (HOSPITAL_COMMUNITY)
Admission: RE | Disposition: A | Discharge status: Home / Self Care | Payer: Self-pay | Source: Home / Self Care | Attending: Orthopaedic Surgery

## 2020-01-03 ENCOUNTER — Observation Stay (HOSPITAL_COMMUNITY): Payer: BC Managed Care – PPO

## 2020-01-03 ENCOUNTER — Observation Stay (HOSPITAL_COMMUNITY)
Admission: RE | Admit: 2020-01-03 | Discharge: 2020-01-04 | Disposition: A | Discharge status: Home / Self Care | Payer: BC Managed Care – PPO | Attending: Orthopaedic Surgery | Admitting: Orthopaedic Surgery

## 2020-01-03 ENCOUNTER — Other Ambulatory Visit: Payer: Self-pay

## 2020-01-03 ENCOUNTER — Encounter (HOSPITAL_COMMUNITY): Payer: Self-pay | Admitting: Orthopaedic Surgery

## 2020-01-03 DIAGNOSIS — Z96642 Presence of left artificial hip joint: Secondary | ICD-10-CM | POA: Diagnosis not present

## 2020-01-03 DIAGNOSIS — Z96652 Presence of left artificial knee joint: Secondary | ICD-10-CM

## 2020-01-03 DIAGNOSIS — M1712 Unilateral primary osteoarthritis, left knee: Principal | ICD-10-CM

## 2020-01-03 DIAGNOSIS — M25562 Pain in left knee: Secondary | ICD-10-CM | POA: Diagnosis present

## 2020-01-03 HISTORY — PX: TOTAL KNEE ARTHROPLASTY: SHX125

## 2020-01-03 SURGERY — ARTHROPLASTY, KNEE, TOTAL
Anesthesia: Spinal | Site: Knee | Laterality: Left

## 2020-01-03 MED ORDER — KETOROLAC TROMETHAMINE 15 MG/ML IJ SOLN
15.0000 mg | Freq: Four times a day (QID) | INTRAMUSCULAR | Status: DC
  Administered 2020-01-03 – 2020-01-04 (×3): 15 mg via INTRAVENOUS
  Filled 2020-01-03 (×3): qty 1

## 2020-01-03 MED ORDER — FOLIC ACID 1 MG PO TABS
1.0000 mg | ORAL_TABLET | Freq: Every day | ORAL | Status: DC
  Administered 2020-01-04: 10:00:00 1 mg via ORAL
  Filled 2020-01-03: qty 1

## 2020-01-03 MED ORDER — OXYCODONE HCL 5 MG PO TABS
5.0000 mg | ORAL_TABLET | ORAL | Status: DC | PRN
  Administered 2020-01-03 – 2020-01-04 (×3): 10 mg via ORAL
  Filled 2020-01-03 (×3): qty 2

## 2020-01-03 MED ORDER — BUPIVACAINE IN DEXTROSE 0.75-8.25 % IT SOLN
INTRATHECAL | Status: DC | PRN
  Administered 2020-01-03: 1.6 mL via INTRATHECAL

## 2020-01-03 MED ORDER — PROPOFOL 10 MG/ML IV BOLUS
INTRAVENOUS | Status: DC | PRN
  Administered 2020-01-03: 50 mg via INTRAVENOUS

## 2020-01-03 MED ORDER — POVIDONE-IODINE 10 % EX SWAB
2.0000 "application " | Freq: Once | CUTANEOUS | Status: AC
  Administered 2020-01-03: 2 via TOPICAL

## 2020-01-03 MED ORDER — MIDAZOLAM HCL 2 MG/2ML IJ SOLN
INTRAMUSCULAR | Status: AC
  Administered 2020-01-03: 11:00:00 1 mg
  Filled 2020-01-03: qty 2

## 2020-01-03 MED ORDER — ONDANSETRON HCL 4 MG PO TABS: 4.0000 mg | ORAL_TABLET | Freq: Four times a day (QID) | ORAL | Status: DC | PRN

## 2020-01-03 MED ORDER — CHLORHEXIDINE GLUCONATE 0.12 % MT SOLN: 15.0000 mL | Freq: Once | OROMUCOSAL | Status: AC

## 2020-01-03 MED ORDER — PHENOL 1.4 % MT LIQD: 1.0000 | OROMUCOSAL | Status: DC | PRN

## 2020-01-03 MED ORDER — MIDAZOLAM HCL 2 MG/2ML IJ SOLN: 2.0000 mg | Freq: Once | INTRAMUSCULAR | Status: DC

## 2020-01-03 MED ORDER — PROPOFOL 1000 MG/100ML IV EMUL
INTRAVENOUS | Status: AC
  Filled 2020-01-03: qty 100

## 2020-01-03 MED ORDER — ONDANSETRON HCL 4 MG/2ML IJ SOLN: 4.0000 mg | Freq: Four times a day (QID) | INTRAMUSCULAR | Status: DC | PRN

## 2020-01-03 MED ORDER — DIPHENHYDRAMINE HCL 12.5 MG/5ML PO ELIX
12.5000 mg | ORAL_SOLUTION | ORAL | Status: DC | PRN
  Filled 2020-01-03: qty 10

## 2020-01-03 MED ORDER — LACTATED RINGERS IV SOLN: INTRAVENOUS | Status: DC

## 2020-01-03 MED ORDER — METOCLOPRAMIDE HCL 5 MG PO TABS: 5.0000 mg | ORAL_TABLET | Freq: Three times a day (TID) | ORAL | Status: DC | PRN

## 2020-01-03 MED ORDER — BUPIVACAINE-EPINEPHRINE 0.5% -1:200000 IJ SOLN
INTRAMUSCULAR | Status: AC
  Filled 2020-01-03: qty 1

## 2020-01-03 MED ORDER — METHOCARBAMOL 1000 MG/10ML IJ SOLN
500.0000 mg | Freq: Four times a day (QID) | INTRAVENOUS | Status: DC | PRN
  Filled 2020-01-03: qty 5

## 2020-01-03 MED ORDER — TRANEXAMIC ACID-NACL 1000-0.7 MG/100ML-% IV SOLN
INTRAVENOUS | Status: AC
  Filled 2020-01-03: qty 100

## 2020-01-03 MED ORDER — ASPIRIN EC 325 MG PO TBEC
325.0000 mg | DELAYED_RELEASE_TABLET | Freq: Two times a day (BID) | ORAL | Status: DC
  Administered 2020-01-03 – 2020-01-04 (×2): 325 mg via ORAL
  Filled 2020-01-03 (×3): qty 1

## 2020-01-03 MED ORDER — PROMETHAZINE HCL 25 MG/ML IJ SOLN
6.2500 mg | INTRAMUSCULAR | Status: DC | PRN
Start: 2020-01-03 — End: 2020-01-03

## 2020-01-03 MED ORDER — PANTOPRAZOLE SODIUM 40 MG PO TBEC
40.0000 mg | DELAYED_RELEASE_TABLET | Freq: Every day | ORAL | Status: DC
  Administered 2020-01-03 – 2020-01-04 (×2): 40 mg via ORAL
  Filled 2020-01-03 (×2): qty 1

## 2020-01-03 MED ORDER — GABAPENTIN 100 MG PO CAPS
100.0000 mg | ORAL_CAPSULE | Freq: Three times a day (TID) | ORAL | Status: DC
  Administered 2020-01-03 – 2020-01-04 (×3): 100 mg via ORAL
  Filled 2020-01-03 (×3): qty 1

## 2020-01-03 MED ORDER — FENTANYL CITRATE (PF) 100 MCG/2ML IJ SOLN
INTRAMUSCULAR | Status: AC
  Administered 2020-01-03: 11:00:00 50 ug
  Filled 2020-01-03: qty 2

## 2020-01-03 MED ORDER — ONDANSETRON HCL 4 MG/2ML IJ SOLN
INTRAMUSCULAR | Status: DC | PRN
  Administered 2020-01-03: 4 mg via INTRAVENOUS

## 2020-01-03 MED ORDER — METHOCARBAMOL 500 MG PO TABS: 500.0000 mg | ORAL_TABLET | Freq: Four times a day (QID) | ORAL | 1 refills | Status: AC | PRN

## 2020-01-03 MED ORDER — ALUM & MAG HYDROXIDE-SIMETH 200-200-20 MG/5ML PO SUSP: 30.0000 mL | ORAL | Status: DC | PRN

## 2020-01-03 MED ORDER — FENTANYL CITRATE (PF) 100 MCG/2ML IJ SOLN: 25.0000 ug | INTRAMUSCULAR | Status: DC | PRN

## 2020-01-03 MED ORDER — ORAL CARE MOUTH RINSE: 15.0000 mL | Freq: Once | OROMUCOSAL | Status: AC

## 2020-01-03 MED ORDER — PROPOFOL 500 MG/50ML IV EMUL
INTRAVENOUS | Status: DC | PRN
  Administered 2020-01-03: 75 ug/kg/min via INTRAVENOUS

## 2020-01-03 MED ORDER — ACETAMINOPHEN 325 MG PO TABS: 325.0000 mg | ORAL_TABLET | Freq: Four times a day (QID) | ORAL | Status: DC | PRN

## 2020-01-03 MED ORDER — OXYCODONE HCL 5 MG PO TABS: 10.0000 mg | ORAL_TABLET | ORAL | Status: DC | PRN

## 2020-01-03 MED ORDER — SODIUM CHLORIDE 0.9 % IR SOLN
Status: DC | PRN
  Administered 2020-01-03: 3000 mL

## 2020-01-03 MED ORDER — OXYCODONE HCL 5 MG PO TABS: 5.0000 mg | ORAL_TABLET | ORAL | 0 refills | Status: DC | PRN

## 2020-01-03 MED ORDER — METOCLOPRAMIDE HCL 5 MG/ML IJ SOLN: 5.0000 mg | Freq: Three times a day (TID) | INTRAMUSCULAR | Status: DC | PRN

## 2020-01-03 MED ORDER — 0.9 % SODIUM CHLORIDE (POUR BTL) OPTIME
TOPICAL | Status: DC | PRN
  Administered 2020-01-03: 13:00:00 1000 mL

## 2020-01-03 MED ORDER — OXYCODONE HCL 5 MG PO TABS: 5.0000 mg | ORAL_TABLET | Freq: Once | ORAL | Status: DC | PRN

## 2020-01-03 MED ORDER — CEFAZOLIN SODIUM-DEXTROSE 2-4 GM/100ML-% IV SOLN
2.0000 g | Freq: Four times a day (QID) | INTRAVENOUS | Status: AC
  Administered 2020-01-03 (×2): 2 g via INTRAVENOUS
  Filled 2020-01-03 (×2): qty 100

## 2020-01-03 MED ORDER — SODIUM CHLORIDE 0.9 % IV SOLN: INTRAVENOUS | Status: DC

## 2020-01-03 MED ORDER — ASPIRIN 325 MG PO TBEC: 325.0000 mg | DELAYED_RELEASE_TABLET | Freq: Two times a day (BID) | ORAL | 0 refills | Status: DC

## 2020-01-03 MED ORDER — VITAMIN D 25 MCG (1000 UNIT) PO TABS
5000.0000 [IU] | ORAL_TABLET | Freq: Every day | ORAL | Status: DC
  Administered 2020-01-04: 09:00:00 5000 [IU] via ORAL
  Filled 2020-01-03: qty 5

## 2020-01-03 MED ORDER — HYDROMORPHONE HCL 1 MG/ML IJ SOLN
1.0000 mg | INTRAMUSCULAR | Status: DC | PRN
  Administered 2020-01-03: 1 mg via INTRAVENOUS
  Filled 2020-01-03: qty 1

## 2020-01-03 MED ORDER — ONDANSETRON HCL 4 MG/2ML IJ SOLN
INTRAMUSCULAR | Status: AC
  Filled 2020-01-03: qty 2

## 2020-01-03 MED ORDER — CHLORHEXIDINE GLUCONATE 0.12 % MT SOLN
OROMUCOSAL | Status: AC
  Administered 2020-01-03: 10:00:00 15 mL via OROMUCOSAL
  Filled 2020-01-03: qty 15

## 2020-01-03 MED ORDER — OXYCODONE HCL 5 MG/5ML PO SOLN: 5.0000 mg | Freq: Once | ORAL | Status: DC | PRN

## 2020-01-03 MED ORDER — LIDOCAINE 2% (20 MG/ML) 5 ML SYRINGE
INTRAMUSCULAR | Status: AC
  Filled 2020-01-03: qty 5

## 2020-01-03 MED ORDER — MENTHOL 3 MG MT LOZG: 1.0000 | LOZENGE | OROMUCOSAL | Status: DC | PRN

## 2020-01-03 MED ORDER — DOCUSATE SODIUM 100 MG PO CAPS: 100.0000 mg | ORAL_CAPSULE | Freq: Two times a day (BID) | ORAL | Status: DC

## 2020-01-03 MED ORDER — PHENYLEPHRINE HCL-NACL 10-0.9 MG/250ML-% IV SOLN
INTRAVENOUS | Status: DC | PRN
  Administered 2020-01-03: 30 ug/min via INTRAVENOUS

## 2020-01-03 MED ORDER — FENTANYL CITRATE (PF) 100 MCG/2ML IJ SOLN: 100.0000 ug | Freq: Once | INTRAMUSCULAR | Status: DC

## 2020-01-03 MED ORDER — LOPERAMIDE HCL 2 MG PO CAPS
4.0000 mg | ORAL_CAPSULE | ORAL | Status: DC | PRN
  Filled 2020-01-03: qty 2

## 2020-01-03 MED ORDER — POLYETHYLENE GLYCOL 3350 17 G PO PACK: 17.0000 g | PACK | Freq: Every day | ORAL | Status: DC | PRN

## 2020-01-03 MED ORDER — ROPIVACAINE HCL 7.5 MG/ML IJ SOLN
INTRAMUSCULAR | Status: DC | PRN
  Administered 2020-01-03: 20 mL via PERINEURAL

## 2020-01-03 MED ORDER — BUPIVACAINE-EPINEPHRINE 0.5% -1:200000 IJ SOLN
INTRAMUSCULAR | Status: DC | PRN
Start: 2020-01-03 — End: 2020-01-03
  Administered 2020-01-03: 40 mL

## 2020-01-03 MED ORDER — METHOCARBAMOL 500 MG PO TABS
500.0000 mg | ORAL_TABLET | Freq: Four times a day (QID) | ORAL | Status: DC | PRN
  Administered 2020-01-03 – 2020-01-04 (×2): 500 mg via ORAL
  Filled 2020-01-03 (×3): qty 1

## 2020-01-03 MED ORDER — TRANEXAMIC ACID-NACL 1000-0.7 MG/100ML-% IV SOLN
1000.0000 mg | INTRAVENOUS | Status: AC
  Administered 2020-01-03: 12:00:00 1000 mg via INTRAVENOUS

## 2020-01-03 MED ORDER — FENTANYL CITRATE (PF) 250 MCG/5ML IJ SOLN
INTRAMUSCULAR | Status: AC
  Filled 2020-01-03: qty 5

## 2020-01-03 SURGICAL SUPPLY — 73 items
BANDAGE ESMARK 6X9 LF (GAUZE/BANDAGES/DRESSINGS) ×1 IMPLANT
BLADE SAG 18X100X1.27 (BLADE) ×3 IMPLANT
BNDG ELASTIC 6X10 VLCR STRL LF (GAUZE/BANDAGES/DRESSINGS) ×3 IMPLANT
BNDG ELASTIC 6X5.8 VLCR STR LF (GAUZE/BANDAGES/DRESSINGS) ×6 IMPLANT
BNDG ESMARK 6X9 LF (GAUZE/BANDAGES/DRESSINGS) ×3
BOWL SMART MIX CTS (DISPOSABLE) ×3 IMPLANT
CLOSURE WOUND 1/2 X4 (GAUZE/BANDAGES/DRESSINGS)
COVER SURGICAL LIGHT HANDLE (MISCELLANEOUS) ×3 IMPLANT
COVER WAND RF STERILE (DRAPES) ×3 IMPLANT
CUFF TOURN SGL QUICK 34 (TOURNIQUET CUFF) ×2
CUFF TOURN SGL QUICK 42 (TOURNIQUET CUFF) IMPLANT
CUFF TRNQT CYL 34X4.125X (TOURNIQUET CUFF) ×1 IMPLANT
DRAPE EXTREMITY T 121X128X90 (DISPOSABLE) ×3 IMPLANT
DRAPE HALF SHEET 40X57 (DRAPES) ×3 IMPLANT
DRAPE U-SHAPE 47X51 STRL (DRAPES) ×3 IMPLANT
DRSG PAD ABDOMINAL 8X10 ST (GAUZE/BANDAGES/DRESSINGS) ×3 IMPLANT
DURAPREP 26ML APPLICATOR (WOUND CARE) ×3 IMPLANT
ELECT CAUTERY BLADE 6.4 (BLADE) ×3 IMPLANT
ELECT REM PT RETURN 9FT ADLT (ELECTROSURGICAL) ×3
ELECTRODE REM PT RTRN 9FT ADLT (ELECTROSURGICAL) ×1 IMPLANT
FACESHIELD WRAPAROUND (MASK) ×6 IMPLANT
FEMORAL POSTERIOR SZ5 LFT (Femur) ×1 IMPLANT
GAUZE SPONGE 4X4 12PLY STRL (GAUZE/BANDAGES/DRESSINGS) ×3 IMPLANT
GAUZE SPONGE 4X4 16PLY XRAY LF (GAUZE/BANDAGES/DRESSINGS) ×3 IMPLANT
GAUZE XEROFORM 1X8 LF (GAUZE/BANDAGES/DRESSINGS) ×3 IMPLANT
GAUZE XEROFORM 5X9 LF (GAUZE/BANDAGES/DRESSINGS) ×3 IMPLANT
GLOVE BIOGEL PI IND STRL 8 (GLOVE) ×2 IMPLANT
GLOVE BIOGEL PI INDICATOR 8 (GLOVE) ×4
GLOVE ORTHO TXT STRL SZ7.5 (GLOVE) ×3 IMPLANT
GLOVE SURG ORTHO 8.0 STRL STRW (GLOVE) ×3 IMPLANT
GOWN STRL REUS W/ TWL LRG LVL3 (GOWN DISPOSABLE) IMPLANT
GOWN STRL REUS W/ TWL XL LVL3 (GOWN DISPOSABLE) ×2 IMPLANT
GOWN STRL REUS W/TWL LRG LVL3 (GOWN DISPOSABLE)
GOWN STRL REUS W/TWL XL LVL3 (GOWN DISPOSABLE) ×4
HANDPIECE INTERPULSE COAX TIP (DISPOSABLE) ×2
IMMOBILIZER KNEE 20 (SOFTGOODS) ×3
IMMOBILIZER KNEE 20 THIGH 36 (SOFTGOODS) ×1 IMPLANT
IMMOBILIZER KNEE 22 UNIV (SOFTGOODS) ×3 IMPLANT
INSERT TIB BEARING PS 5 9 (Miscellaneous) ×3 IMPLANT
INSERT TIB PS SZ5 11 KNEE (Miscellaneous) ×3 IMPLANT
KIT BASIN OR (CUSTOM PROCEDURE TRAY) ×3 IMPLANT
KIT TURNOVER KIT B (KITS) ×3 IMPLANT
KNEE TIBIAL COMPONENT SZ5 (Knees) ×3 IMPLANT
MANIFOLD NEPTUNE II (INSTRUMENTS) ×3 IMPLANT
NEEDLE 18GX1X1/2 (RX/OR ONLY) (NEEDLE) IMPLANT
NS IRRIG 1000ML POUR BTL (IV SOLUTION) ×3 IMPLANT
PACK TOTAL JOINT (CUSTOM PROCEDURE TRAY) ×3 IMPLANT
PAD ABD 8X10 STRL (GAUZE/BANDAGES/DRESSINGS) ×3 IMPLANT
PAD ARMBOARD 7.5X6 YLW CONV (MISCELLANEOUS) ×3 IMPLANT
PADDING CAST ABS 6INX4YD NS (CAST SUPPLIES) ×2
PADDING CAST ABS COTTON 6X4 NS (CAST SUPPLIES) ×1 IMPLANT
PADDING CAST COTTON 6X4 STRL (CAST SUPPLIES) ×3 IMPLANT
PATELLA ASYMMETRIC 38X11 KNEE (Orthopedic Implant) ×3 IMPLANT
PIN FLUTED HEDLESS FIX 3.5X1/8 (PIN) ×3 IMPLANT
POSTERIOR FEMORAL SZ5 LFT (Femur) ×3 IMPLANT
SET HNDPC FAN SPRY TIP SCT (DISPOSABLE) ×1 IMPLANT
SET PAD KNEE POSITIONER (MISCELLANEOUS) ×3 IMPLANT
STAPLER VISISTAT 35W (STAPLE) IMPLANT
STRIP CLOSURE SKIN 1/2X4 (GAUZE/BANDAGES/DRESSINGS) IMPLANT
SUCTION FRAZIER HANDLE 10FR (MISCELLANEOUS) ×2
SUCTION TUBE FRAZIER 10FR DISP (MISCELLANEOUS) ×1 IMPLANT
SUT MNCRL AB 4-0 PS2 18 (SUTURE) IMPLANT
SUT VIC AB 0 CT1 27 (SUTURE) ×2
SUT VIC AB 0 CT1 27XBRD ANBCTR (SUTURE) ×1 IMPLANT
SUT VIC AB 1 CT1 27 (SUTURE) ×4
SUT VIC AB 1 CT1 27XBRD ANBCTR (SUTURE) ×2 IMPLANT
SUT VIC AB 2-0 CT1 27 (SUTURE) ×4
SUT VIC AB 2-0 CT1 TAPERPNT 27 (SUTURE) ×2 IMPLANT
SYR 50ML LL SCALE MARK (SYRINGE) IMPLANT
TOWEL GREEN STERILE (TOWEL DISPOSABLE) ×3 IMPLANT
TOWEL GREEN STERILE FF (TOWEL DISPOSABLE) ×3 IMPLANT
TRAY CATH 16FR W/PLASTIC CATH (SET/KITS/TRAYS/PACK) IMPLANT
WRAP KNEE MAXI GEL POST OP (GAUZE/BANDAGES/DRESSINGS) ×3 IMPLANT

## 2020-01-03 NOTE — Anesthesia Postprocedure Evaluation (Signed)
Anesthesia Post Note  Patient: Philip [name]  Procedure(s) Performed: LEFT TOTAL KNEE ARTHROPLASTY (Left Knee)     Patient location during evaluation: PACU Anesthesia Type: Spinal Level of consciousness: awake and alert Pain management: pain level controlled Vital Signs Assessment: post-procedure vital signs reviewed and stable Respiratory status: spontaneous breathing and respiratory function stable Cardiovascular status: blood pressure returned to baseline and stable Postop Assessment: spinal receding and no apparent nausea or vomiting Anesthetic complications: no   No complications documented.  Last Vitals:  Vitals:   01/03/20 1500 01/03/20 1515  BP: 134/80 116/81  Pulse: 68 73  Resp: 15 13  Temp:    SpO2: 100% 100%    Last Pain:  Vitals:   01/03/20 1515  TempSrc:   PainSc: 0-No pain    LLE Motor Response: Purposeful movement (01/03/20 1515) LLE Sensation: Numbness;No pain;No tingling (01/03/20 1515) RLE Motor Response: Purposeful movement (01/03/20 1515) RLE Sensation: No numbness;No pain;No tingling (01/03/20 1515) L Sensory Level: L5-Outer lower leg, top of foot, great toe (01/03/20 1515) R Sensory Level: S1-Sole of foot, small toes (01/03/20 1515)  Beryle Lathe

## 2020-01-03 NOTE — Anesthesia Procedure Notes (Signed)
Spinal  Patient location during procedure: OR Start time: 01/03/2020 12:05 PM End time: 01/03/2020 12:10 PM Staffing Performed: anesthesiologist  Anesthesiologist: Beryle Lathe, MD Preanesthetic Checklist Completed: patient identified, IV checked, risks and benefits discussed, surgical consent, monitors and equipment checked, pre-op evaluation and timeout performed Spinal Block Patient position: sitting Prep: DuraPrep Patient monitoring: heart rate, cardiac monitor, continuous pulse ox and blood pressure Approach: midline Location: L3-4 Injection technique: single-shot Needle Needle type: Quincke  Needle gauge: 22 G Needle length: 12.7 cm Additional Notes Consent was obtained prior to the procedure with all questions answered and concerns addressed. Risks including, but not limited to, bleeding, infection, nerve damage, paralysis, failed block, inadequate analgesia, allergic reaction, high spinal, itching, and headache were discussed and the patient wished to proceed. Functioning IV was confirmed and monitors were applied. Sterile prep and drape, including hand hygiene, mask, and sterile gloves were used. The patient was positioned and the spine was prepped. The skin was anesthetized with lidocaine. Free flow of clear CSF was obtained prior to injecting local anesthetic into the CSF. The spinal needle aspirated freely following injection. The needle was carefully withdrawn. The patient tolerated the procedure well.   Leslye Peer, MD

## 2020-01-03 NOTE — Brief Op Note (Signed)
01/03/2020  1:55 PM  PATIENT:  Philip [name]  59 y.o. male  PRE-OPERATIVE DIAGNOSIS:  osteoarthritis left knee  POST-OPERATIVE DIAGNOSIS:  osteoarthritis left knee  PROCEDURE:  Procedure(s) with comments: LEFT TOTAL KNEE ARTHROPLASTY (Left) - needs RNFA  SURGEON:  Surgeon(s) and Role:    * Kathryne Hitch, MD - Primary  ASSISTANTS: Hart Carwin, RNFA   ANESTHESIA:   local, regional and spinal  EBL:  20 mL   COUNTS:  YES  TOURNIQUET:   Total Tourniquet Time Documented: Thigh (Left) - 64 minutes Total: Thigh (Left) - 64 minutes   DICTATION: .Other Dictation: Dictation Number [id number]  PLAN OF CARE: Admit for overnight observation  PATIENT DISPOSITION:  PACU - hemodynamically stable.   Delay start of Pharmacological VTE agent (>24hrs) due to surgical blood loss or risk of bleeding: no

## 2020-01-03 NOTE — Transfer of Care (Signed)
Immediate Anesthesia Transfer of Care Note  Patient: Philip [name]  Procedure(s) Performed: LEFT TOTAL KNEE ARTHROPLASTY (Left Knee)  Patient Location: PACU  Anesthesia Type:Spinal and MAC combined with regional for post-op pain  Level of Consciousness: drowsy and patient cooperative  Airway & Oxygen Therapy: Patient Spontanous Breathing  Post-op Assessment: Report given to RN and Post -op Vital signs reviewed and stable  Post vital signs: Reviewed and stable  Last Vitals:  Vitals Value Taken Time  BP 122/79   Temp    Pulse 64   Resp 18   SpO2 100     Last Pain:  Vitals:   01/03/20 1014  TempSrc: Oral  PainSc: 3       Patients Stated Pain Goal: 3 (23/55/73 2202)  Complications: No complications documented.

## 2020-01-03 NOTE — H&P (Signed)
TOTAL KNEE ADMISSION H&P  Patient is being admitted for left total knee arthroplasty.  Subjective:  Chief Complaint:left knee pain.  HPI: Philip [name], 59 y.o. male, has a history of pain and functional disability in the left knee due to arthritis and has failed non-surgical conservative treatments for greater than 12 weeks to includeNSAID's and/or analgesics, corticosteriod injections, viscosupplementation injections, flexibility and strengthening excercises, supervised PT with diminished ADL's post treatment, use of assistive devices, weight reduction as appropriate and activity modification.  Onset of symptoms was gradual, starting 3 years ago with gradually worsening course since that time. The patient noted no past surgery on the left knee(s).  Patient currently rates pain in the left knee(s) at 10 out of 10 with activity. Patient has night pain, worsening of pain with activity and weight bearing, pain that interferes with activities of daily living, pain with passive range of motion, crepitus and joint swelling.  Patient has evidence of subchondral sclerosis, periarticular osteophytes and joint space narrowing by imaging studies. There is no active infection.  Patient Active Problem List   Diagnosis Date Noted  . Unilateral primary osteoarthritis, right knee 03/16/2019  . Unilateral primary osteoarthritis, left knee 03/16/2019  . Osteoarthritis of left hip 07/07/2014  . Status post total replacement of left hip 07/07/2014   Past Medical History:  Diagnosis Date  . Arthritis   . Bulging lumbar disc   . Chest pain on exertion    past 2-3 weeks ---- 12/28/19-patient stated he pulled muscle in chest a long time ago but no recent "chest pain"  . Chronic diarrhea    several years    Past Surgical History:  Procedure Laterality Date  . fractured arm     as child  . SHOULDER ARTHROSCOPY    . TOTAL HIP ARTHROPLASTY Left 07/07/2014   Procedure: LEFT TOTAL HIP ARTHROPLASTY ANTERIOR  APPROACH;  Surgeon: Kathryne Hitch, MD;  Location: [facility];  Service: Orthopedics;  Laterality: Left;    Current Facility-Administered Medications  Medication Dose Route Frequency Provider Last Rate Last Admin  . ceFAZolin (ANCEF) 3 g in dextrose 5 % 50 mL IVPB  3 g Intravenous On Call to OR Kathryne Hitch, MD       Current Outpatient Medications  Medication Sig Dispense Refill Last Dose  . Cholecalciferol (DIALYVITE VITAMIN D 5000) 125 MCG (5000 UT) capsule Take 5,000 Units by mouth daily.     . folic acid (FOLVITE) 1 MG tablet Take 1 mg by mouth daily.     Marland Kitchen gabapentin (NEURONTIN) 100 MG capsule Take 100 mg by mouth at bedtime.     Marland Kitchen ibuprofen (ADVIL) 200 MG tablet Take 800 mg by mouth every 6 (six) hours as needed for headache or moderate pain.     Marland Kitchen loperamide (IMODIUM A-D) 2 MG tablet Take 2 mg by mouth 4 (four) times daily as needed for diarrhea or loose stools.     . methotrexate (RHEUMATREX) 2.5 MG tablet Take 15 mg by mouth every Saturday.      No Known Allergies  Social History   Tobacco Use  . Smoking status: Never Smoker  . Smokeless tobacco: Never Used  Substance Use Topics  . Alcohol use: No    No family history on file.   Review of Systems  Musculoskeletal: Positive for gait problem and joint swelling.  All other systems reviewed and are negative.   Objective:  Physical Exam Vitals reviewed.  Constitutional:      Appearance: Normal appearance.  HENT:     Head: Normocephalic and atraumatic.  Eyes:     Extraocular Movements: Extraocular movements intact.     Pupils: Pupils are equal, round, and reactive to light.  Cardiovascular:     Rate and Rhythm: Normal rate.     Pulses: Normal pulses.  Pulmonary:     Effort: Pulmonary effort is normal.  Abdominal:     Palpations: Abdomen is soft.  Musculoskeletal:     Cervical back: Normal range of motion.     Left knee: Effusion, bony tenderness and crepitus present. Decreased range of motion.  Tenderness present over the medial joint line, lateral joint line and patellar tendon. Abnormal alignment and abnormal meniscus.  Neurological:     Mental Status: He is alert and oriented to person, place, and time.  Psychiatric:        Behavior: Behavior normal.     Vital signs in last 24 hours:    Labs:   Estimated body mass index is 39.05 kg/m as calculated from the following:   Height as of 12/28/19: 6' (1.829 m).   Weight as of 12/28/19: 130.6 kg.   Imaging Review Plain radiographs demonstrate severe degenerative joint disease of the left knee(s). The overall alignment ismild varus. The bone quality appears to be good for age and reported activity level.      Assessment/Plan:  End stage arthritis, left knee   The patient history, physical examination, clinical judgment of the provider and imaging studies are consistent with end stage degenerative joint disease of the left knee(s) and total knee arthroplasty is deemed medically necessary. The treatment options including medical management, injection therapy arthroscopy and arthroplasty were discussed at length. The risks and benefits of total knee arthroplasty were presented and reviewed. The risks due to aseptic loosening, infection, stiffness, patella tracking problems, thromboembolic complications and other imponderables were discussed. The patient acknowledged the explanation, agreed to proceed with the plan and consent was signed. Patient is being admitted for inpatient treatment for surgery, pain control, PT, OT, prophylactic antibiotics, VTE prophylaxis, progressive ambulation and ADL's and discharge planning. The patient is planning to be discharged home with home health services

## 2020-01-03 NOTE — Evaluation (Signed)
Physical Therapy Evaluation Patient Details Name: Philip [name] MRN: [id number] DOB: 09-23-1960 Today's Date: 01/03/2020   History of Present Illness  Pt is a 59 y/o male s/p L TKA. PMH includes L THA.  Clinical Impression  Pt is s/p surgery above with deficits below. Pt with bowel urgency throughout session and ambulated to bathroom X2. Min to min guard A for steadying and cues for safety throughout. Further mobility limited. Anticipate pt will progress well once feeling better. Reviewed knee precautions. Will continue to follow acutely.     Follow Up Recommendations Follow surgeons recommendation for DC plan and follow-up therapies    Equipment Recommendations  Rolling walker with 5" wheels;3in1 (PT) (bariatric RW and 3 in 1)    Recommendations for Other Services       Precautions / Restrictions Precautions Precautions: Knee Precaution Booklet Issued: No Precaution Comments: Verbally reviewed precautions Required Braces or Orthoses: Knee Immobilizer - Left Restrictions Weight Bearing Restrictions: Yes LLE Weight Bearing: Weight bearing as tolerated      Mobility  Bed Mobility               General bed mobility comments: Standing with RN in room at EOB upon entry    Transfers Overall transfer level: Needs assistance Equipment used: Rolling walker (2 wheeled) Transfers: Sit to/from Stand Sit to Stand: Min assist         General transfer comment: Stood from toilet X2 with min A for lift assist. Cues for hand placement.  Ambulation/Gait Ambulation/Gait assistance: Min assist;Min guard Gait Distance (Feet): 15 Feet Assistive device: Rolling walker (2 wheeled) Gait Pattern/deviations: Step-to pattern;Decreased step length - right;Decreased step length - left;Decreased weight shift to left;Antalgic Gait velocity: decreased   General Gait Details: Antalgic gait. CUes for safe gait speed and for sequencing using RW as pt rushing to bathroom. Pt requiring 2  trips to bathroom during session. Min to min guard A for steadying and safety.  Stairs            Wheelchair Mobility    Modified Rankin (Stroke Patients Only)       Balance Overall balance assessment: Needs assistance Sitting-balance support: No upper extremity supported;Feet supported Sitting balance-Leahy Scale: Fair     Standing balance support: Bilateral upper extremity supported;During functional activity Standing balance-Leahy Scale: Poor Standing balance comment: Reliant on BUE support                             Pertinent Vitals/Pain Pain Assessment: Faces Faces Pain Scale: Hurts even more Pain Location: L knee Pain Descriptors / Indicators: Aching;Operative site guarding Pain Intervention(s): Monitored during session;Limited activity within patient's tolerance;Repositioned    Home Living Family/patient expects to be discharged to:: Private residence Living Arrangements: Spouse/significant other;Children Available Help at Discharge: Family Type of Home: House Home Access: Stairs to enter Entrance Stairs-Rails: None Secretary/administrator of Steps: 1 Home Layout: One level Home Equipment: None      Prior Function Level of Independence: Independent               Hand Dominance        Extremity/Trunk Assessment   Upper Extremity Assessment Upper Extremity Assessment: Overall WFL for tasks assessed    Lower Extremity Assessment Lower Extremity Assessment: LLE deficits/detail LLE Deficits / Details: Deficits consistent with post op pain and weakness.    Cervical / Trunk Assessment Cervical / Trunk Assessment: Normal  Communication   Communication: No difficulties  Cognition Arousal/Alertness: Awake/alert Behavior During Therapy: WFL for tasks assessed/performed Overall Cognitive Status: Within Functional Limits for tasks assessed                                        General Comments General comments  (skin integrity, edema, etc.): Pt's wife present during session.    Exercises     Assessment/Plan    PT Assessment Patient needs continued PT services  PT Problem List Decreased strength;Decreased activity tolerance;Decreased balance;Decreased mobility;Decreased knowledge of use of DME;Decreased safety awareness;Decreased knowledge of precautions       PT Treatment Interventions DME instruction;Gait training;Stair training;Functional mobility training;Therapeutic exercise;Therapeutic activities;Balance training;Patient/family education    PT Goals (Current goals can be found in the Care Plan section)  Acute Rehab PT Goals Patient Stated Goal: to feel better PT Goal Formulation: With patient/family Time For Goal Achievement: 01/17/20 Potential to Achieve Goals: Good    Frequency 7X/week   Barriers to discharge        Co-evaluation               AM-PAC PT "6 Clicks" Mobility  Outcome Measure Help needed turning from your back to your side while in a flat bed without using bedrails?: A Little Help needed moving from lying on your back to sitting on the side of a flat bed without using bedrails?: A Little Help needed moving to and from a bed to a chair (including a wheelchair)?: A Little Help needed standing up from a chair using your arms (e.g., wheelchair or bedside chair)?: A Little Help needed to walk in hospital room?: A Little Help needed climbing 3-5 steps with a railing? : A Lot 6 Click Score: 17    End of Session Equipment Utilized During Treatment: Gait belt Activity Tolerance: Patient tolerated treatment well Patient left: Other (comment);with call bell/phone within reach (on toilet; RN aware) Nurse Communication: Mobility status PT Visit Diagnosis: Unsteadiness on feet (R26.81);Muscle weakness (generalized) (M62.81)    Time: 9562-1308 PT Time Calculation (min) (ACUTE ONLY): 31 min   Charges:   PT Evaluation $PT Eval Low Complexity: 1 Low PT  Treatments $Gait Training: 8-22 mins        Cindee Salt, DPT  Acute Rehabilitation Services  Pager: [phone] Office: [phone]   Lehman Prom 01/03/2020, 5:22 PM

## 2020-01-03 NOTE — Op Note (Signed)
NAMERANEY, KOEPPEN MEDICAL RECORD RK:[id number] ACCOUNT [id number] DATE OF BIRTH:[birth year] FACILITY: [facility] LOCATION: [facility] PHYSICIAN:Anneke Cundy Aretha Parrot, MD  OPERATIVE REPORT  DATE OF PROCEDURE:  01/03/2020  PREOPERATIVE DIAGNOSES:  Primary osteoarthritis and degenerative joint disease, left knee.  POSTOPERATIVE DIAGNOSES:  Primary osteoarthritis and degenerative joint disease, left knee.  PROCEDURE:  Left total knee arthroplasty.  IMPLANTS:  Stryker Triathlon press-fit knee system with size 5 femur, size 5 tibial tray, 9 mm fixed bearing polyethylene insert, size 38 patellar button.  SURGEON:  Vanita Panda. Magnus Ivan, MD  ASSISTANT:  Hart Carwin, RNFA  ANESTHESIA: 1.  Left lower extremity adductor canal block. 2.  Spinal. 3.  Local with 0.5% Marcaine with epinephrine around the arthrotomy.  TOURNIQUET TIME:  Just over 1 hour.  BLOOD LOSS:  Less than 100 mL.  ANTIBIOTICS:  Three grams IV Ancef.  COMPLICATIONS:  None.  INDICATIONS:  The patient is a 59 year old gentleman who has almost a 40 BMI with severe arthritis involving his left knee.  He has had interventions such as multiple injections and arthroscopic intervention and with time, he has developed worsening  arthritis of that knee.  His pain is daily and it swells.  His left knee pain at this point is detrimentally affecting his mobility, his quality of life and his activities of daily living.  He does wish to proceed with total knee arthroplasty.  He  understands that he has a heightened risk of acute blood loss anemia, nerve and vessel injury, fracture, infection, DVT, and implant failure, with all these being heightened because of his obesity.  He understands our goals are to decrease pain, improve  mobility and improve quality of life.  DESCRIPTION OF PROCEDURE:  After informed consent was obtained, appropriate left knee was marked and anesthesia obtained an adductor canal block of the left lower  extremity in the holding room.  He was then brought to the operating room, sat up on the  operating table where spinal anesthesia was obtained.  He was laid in the supine position.  A Foley catheter was placed and nonsterile tourniquet was placed around his upper left thigh and his left thigh, knee, leg, ankle and foot were prepped and draped  with DuraPrep and sterile drapes.  A timeout was called.  He was identified as correct patient, correct left knee.  We then used an Esmarch to wrap out the leg and tourniquet inflated to 300 mm of pressure.  I made a direct midline incision over the  patella and carried this proximally and distally.  I dissected down the knee joint and carried out a medial parapatellar arthrotomy, finding a moderate joint effusion and significant osteoarthritis throughout the knee with periarticular osteophytes and  worn cartilage.  With the knee in a flexed position, we removed remnants of ACL, PCL, medial and lateral meniscus.  We used our extramedullary cutting guide for making our tibia cut, taking 9 mm off the high side and correcting for varus and valgus and  neutral slope.  We made this cut without difficulty.  We then went to the femur for femoral cutting guide through the intramedullary area of the notch of the femur.  We put our distal femoral cutting block for a left knee at 5 degrees externally rotated  and for a 10 mm distal femoral cut, we made this cut without difficulty and brought the knee back down to full extension and with a 9 mm extension block, had achieved full extension.  We went back to the  femur and put our femoral sizing guide based off  the epicondylar axis.  Based off this, we chose a size 5 femur, we put our size 5, 4-in-1 cutting block and made our anterior and posterior cuts, followed by our chamfer cuts.  We then made our femoral box cut.  Attention was then turned to the tibia.   We chose a size 5 tibial tray for coverage, setting the rotation off the  tibial tubercle and the femur.  We made our keel punch off of this, all for press-fit implants.  We then trialed a size 9 polyethylene insert, with our size 5 left femur and our 5  tibial tray.  This gave [place] full extension and good flexion.  I felt like with his size and weight, we would need to try to get in a thicker poly with the final.  I felt this may stretch the soft tissues with just the trials.  We then made our patellar  cut and drilled 3 holes for a size 38 press-fit patellar button.  I put this knee through several cycles of motion.  We then removed all instrumentation from the knee and irrigated the knee with normal saline solution.  We dried the knee real well and  put our Marcaine with epinephrine mixture around the knee.  We then dried the knee real well and with the knee in a flexed position, we placed our size 5 press-fit tibial tray followed by our size 5 press-fit left femur.  We then tried to place an 11 mm  fixed bearing polyethylene insert, but we were just unsuccessful doing that because the knee was too tight, so we went with a 9 mm insert.  We then press-fit our patellar button.  I put the knee through several cycles of motion.  Once we got the  instrumentation and implants in place, I was pleased with range of motion and stability.  We then irrigated the knee again with normal saline solution using pulsatile lavage and then dried the knee real well.  We then closed the arthrotomy with  interrupted #1 Vicryl suture followed by 0 Vicryl to close deep tissue and 2-0 Vicryl to close subcutaneous tissue.  The skin was closed with staples.  Xeroform and well-padded sterile dressing was applied.  He was taken to recovery room in stable  condition with all final counts being correct.  There were no complications noted.  HN/NUANCE  D:01/03/2020 T:01/03/2020 JOB:[id number]

## 2020-01-03 NOTE — Anesthesia Preprocedure Evaluation (Addendum)
Anesthesia Evaluation  Patient identified by MRN, date of birth, ID band Patient awake    Reviewed: Allergy & Precautions, NPO status , Patient's Chart, lab work & pertinent test results  History of Anesthesia Complications Negative for: history of anesthetic complications  Airway Mallampati: III  TM Distance: >3 FB Neck ROM: Full    Dental  (+) Dental Advisory Given   Pulmonary neg pulmonary ROS,    Pulmonary exam normal        Cardiovascular negative cardio ROS Normal cardiovascular exam     Neuro/Psych negative neurological ROS  negative psych ROS   GI/Hepatic Neg liver ROS,  Chronic diarrhea    Endo/Other   Obesity   Renal/GU negative Renal ROS     Musculoskeletal  (+) Arthritis ,   Abdominal (+) + obese,   Peds  Hematology negative hematology ROS (+)   Anesthesia Other Findings Covid test negative   Reproductive/Obstetrics                            Anesthesia Physical Anesthesia Plan  ASA: II  Anesthesia Plan: Spinal   Post-op Pain Management:    Induction:   PONV Risk Score and Plan: 1 and Treatment may vary due to age or medical condition and Propofol infusion  Airway Management Planned: Natural Airway and Simple Face Mask  Additional Equipment: None  Intra-op Plan:   Post-operative Plan:   Informed Consent: I have reviewed the patients History and Physical, chart, labs and discussed the procedure including the risks, benefits and alternatives for the proposed anesthesia with the patient or authorized representative who has indicated his/her understanding and acceptance.       Plan Discussed with: CRNA and Anesthesiologist  Anesthesia Plan Comments: (Labs reviewed, platelets acceptable. Discussed risks and benefits of spinal, including spinal/epidural hematoma, infection, failed block, and PDPH. Patient expressed understanding and wished to proceed. )        Anesthesia Quick Evaluation

## 2020-01-03 NOTE — Anesthesia Procedure Notes (Signed)
Procedure Name: MAC Date/Time: 01/03/2020 12:11 PM Performed by: Thelma Comp, CRNA Pre-anesthesia Checklist: Patient identified, Emergency Drugs available, Suction available, Patient being monitored and Timeout performed Oxygen Delivery Method: Simple face mask Airway Equipment and Method: Oral airway Dental Injury: Teeth and Oropharynx as per pre-operative assessment

## 2020-01-03 NOTE — Discharge Instructions (Signed)

## 2020-01-03 NOTE — Anesthesia Procedure Notes (Addendum)
Anesthesia Regional Block: Adductor canal block   Pre-Anesthetic Checklist: ,, timeout performed, Correct Patient, Correct Site, Correct Laterality, Correct Procedure, Correct Position, site marked, Risks and benefits discussed,  Surgical consent,  Pre-op evaluation,  At surgeon's request and post-op pain management  Laterality: Left  Prep: chloraprep       Needles:  Injection technique: Single-shot  Needle Type: Echogenic Needle     Needle Length: 10cm  Needle Gauge: 21     Additional Needles:   Narrative:  Start time: 01/03/2020 11:00 AM End time: 01/03/2020 11:04 AM Injection made incrementally with aspirations every 5 mL.  Performed by: Personally  Anesthesiologist: Beryle Lathe, MD  Additional Notes: No pain on injection. No increased resistance to injection. Injection made in 5cc increments. Good needle visualization. Patient tolerated the procedure well.

## 2020-01-04 ENCOUNTER — Telehealth: Payer: Self-pay | Admitting: Orthopaedic Surgery

## 2020-01-04 ENCOUNTER — Encounter (HOSPITAL_COMMUNITY): Payer: Self-pay | Admitting: Orthopaedic Surgery

## 2020-01-04 DIAGNOSIS — M1712 Unilateral primary osteoarthritis, left knee: Secondary | ICD-10-CM | POA: Diagnosis not present

## 2020-01-04 LAB — CBC
HCT: 40.7 % (ref 39.0–52.0)
Hemoglobin: 13.2 g/dL (ref 13.0–17.0)
MCH: 29.7 pg (ref 26.0–34.0)
MCHC: 32.4 g/dL (ref 30.0–36.0)
MCV: 91.5 fL (ref 80.0–100.0)
Platelets: 166 10*3/uL (ref 150–400)
RBC: 4.45 MIL/uL (ref 4.22–5.81)
RDW: 13.3 % (ref 11.5–15.5)
WBC: 10 10*3/uL (ref 4.0–10.5)
nRBC: 0 % (ref 0.0–0.2)

## 2020-01-04 LAB — BASIC METABOLIC PANEL
Anion gap: 10 (ref 5–15)
BUN: 16 mg/dL (ref 6–20)
CO2: 25 mmol/L (ref 22–32)
Calcium: 8.9 mg/dL (ref 8.9–10.3)
Chloride: 102 mmol/L (ref 98–111)
Creatinine, Ser: 1.13 mg/dL (ref 0.61–1.24)
GFR, Estimated: >60 mL/min (ref >60)
Glucose, Bld: 132 mg/dL — ABNORMAL HIGH (ref 70–99)
Potassium: 3.9 mmol/L (ref 3.5–5.1)
Sodium: 137 mmol/L (ref 135–145)

## 2020-01-04 NOTE — Progress Notes (Signed)
Patient ID: Philip [name], male   DOB: [birth year], 59 y.o.   MRN: [id number] Doing well.  Vitals stable.  Left knee stable.  Can be discharged to home today.

## 2020-01-04 NOTE — Plan of Care (Signed)
Patient alert and oriented, mae's well, voiding adequate amount of urine, swallowing without difficulty, no c/o pain at time of discharge. Patient discharged home with family. Script and discharged instructions given to patient. Patient and family stated understanding of instructions given. Patient has an appointment with Dr. Gayla Doss

## 2020-01-04 NOTE — Progress Notes (Signed)
Physical Therapy Treatment Patient Details Name: Philip [name] MRN: [id number] DOB: [birth year] Today's Date: 01/04/2020    History of Present Illness Pt is a 59 y/o male s/p L TKA. PMH includes L THA.    PT Comments    Pt fully participated in session with increased time needed due to pain. Pt requiring increased cueing for sequencing. Pt given HEP with increased education on positioning (no towel under knee and to maintain knee extension when resting). Education on use of knee immobilizer until able to perform SLR and SAQ without assist. Pt able to perform HEP with handout and educated to have wife assist as needed. Pt verbalized understanding. Pt performed ambulation wtihout assist with improved gait mechanics following cueing. Pt educated on use of ice for pain management as well as education and demonstration on step negotiation, pt verbalized understanding. Pt is safe to d/c home with wife assist and follow up PT to progress ROM, strength, coordination, gait, endurance and pain to maximize independence with functional mobility.     Follow Up Recommendations  Follow surgeon's recommendation for DC plan and follow-up therapies     Equipment Recommendations  Rolling walker with 5" wheels;3in1 (PT)    Recommendations for Other Services       Precautions / Restrictions Precautions Precautions: Knee Precaution Booklet Issued: Yes (comment) Required Braces or Orthoses: Knee Immobilizer - Left Knee Immobilizer - Left: On when out of bed or walking (pt unable to perform SLR or SAQ without assist, recommended to continue to wear when OOB until able to perform) Restrictions Weight Bearing Restrictions: Yes LLE Weight Bearing: Weight bearing as tolerated    Mobility  Bed Mobility Overal bed mobility: Needs Assistance Bed Mobility: Supine to Sit     Supine to sit: Supervision;HOB elevated     General bed mobility comments: increased time needed and verbal cueing for  sequencing  Transfers Overall transfer level: Needs assistance Equipment used: Rolling walker (2 wheeled) Transfers: Sit to/from Stand Sit to Stand: Supervision         General transfer comment: verbal cueing for hand placement  Ambulation/Gait Ambulation/Gait assistance: Supervision Gait Distance (Feet): 100 Feet Assistive device: Rolling walker (2 wheeled)       General Gait Details: pt wearing knee immobilizer due to inability to perform SAQ or SLR without assist. Pt educated on purpose of KI to increase stability until quad more active. Pt given cueing to perform step through gait pattern, with cueing pt able to perform, verbal cueing to stand up tall to decreased forward flexed posture   Stairs             Wheelchair Mobility    Modified Rankin (Stroke Patients Only)       Balance Overall balance assessment: Needs assistance Sitting-balance support: No upper extremity supported;Feet supported Sitting balance-Leahy Scale: Fair     Standing balance support: During functional activity;Single extremity supported Standing balance-Leahy Scale: Poor                              Cognition Arousal/Alertness: Awake/alert Behavior During Therapy: WFL for tasks assessed/performed Overall Cognitive Status: Within Functional Limits for tasks assessed                                        Exercises Total Joint Exercises Ankle Circles/Pumps: AROM;Both;10 reps;Supine Quad Sets: AROM;Left;10 reps;Supine (  towel under L ankle) Towel Squeeze: AROM;Both;10 reps;Supine Short Arc Quad: AAROM;Left;10 reps;Supine Heel Slides: AAROM;Left;10 reps;Supine Hip ABduction/ADduction: AAROM;Left;10 reps;Supine Straight Leg Raises: AAROM;Left;10 reps;Supine Long Arc Quad: AAROM;Left;10 reps;Seated (limited ROM) Knee Flexion: AAROM;Left;10 reps;Seated (limited ROM) Goniometric ROM: lacking 8 extension flexion approx 60    General Comments  education  and demonstration on stairs with RW, education on positioning and ice, pt verbalized understanding      Pertinent Vitals/Pain Faces Pain Scale: Hurts even more Pain Location: L knee Pain Descriptors / Indicators: Aching;Operative site guarding    Home Living                      Prior Function            PT Goals (current goals can now be found in the care plan section) Acute Rehab PT Goals Patient Stated Goal: to feel better PT Goal Formulation: With patient/family Time For Goal Achievement: 01/17/20 Potential to Achieve Goals: Good Progress towards PT goals: Progressing toward goals    Frequency    7X/week      PT Plan Current plan remains appropriate    Co-evaluation              AM-PAC PT "6 Clicks" Mobility   Outcome Measure  Help needed turning from your back to your side while in a flat bed without using bedrails?: None Help needed moving from lying on your back to sitting on the side of a flat bed without using bedrails?: None Help needed moving to and from a bed to a chair (including a wheelchair)?: A Little Help needed standing up from a chair using your arms (e.g., wheelchair or bedside chair)?: A Little Help needed to walk in hospital room?: A Little Help needed climbing 3-5 steps with a railing? : A Little 6 Click Score: 20    End of Session Equipment Utilized During Treatment: Gait belt Activity Tolerance: Patient tolerated treatment well Patient left: with call bell/phone within reach;in chair Nurse Communication: Mobility status PT Visit Diagnosis: Unsteadiness on feet (R26.81);Muscle weakness (generalized) (M62.81)     Time: 0812-0911 PT Time Calculation (min) (ACUTE ONLY): 59 min  Charges:  $Gait Training: 23-37 mins $Therapeutic Exercise: 23-37 mins                     Ginette Otto, DPT Acute Rehabilitation Services [phone]   Lucretia Field 01/04/2020, 9:29 AM

## 2020-01-04 NOTE — TOC Initial Note (Signed)
Transition of Care [facility]) - Initial/Assessment Note    Patient Details  Name: Philip [name] MRN: [id number] Date of Birth: 04-16-1960  Transition of Care [facility]) CM/SW Contact:    Mearl Latin, LCSW Phone Number: 01/04/2020, 11:13 AM  Clinical Narrative:                 CSW received consult regarding home health PT. CSW sent referral to and was declined by: [facility], [facility], [facility], [facility], [facility], [facility], and [facility]. Per MD, acceptable for patient to have outpatient rehab. Referral sent in Epic to [facility].   Expected Discharge Plan: OP Rehab Barriers to Discharge: No Barriers Identified   Patient Goals and CMS Choice   CMS Medicare.gov Compare Post Acute Care list provided to:: Patient Choice offered to / list presented to : Patient  Expected Discharge Plan and Services Expected Discharge Plan: OP Rehab   Discharge Planning Services: CM Consult   Living arrangements for the past 2 months: Single Family Home Expected Discharge Date: 01/04/20                                    Prior Living Arrangements/Services Living arrangements for the past 2 months: Single Family Home Lives with:: Spouse Patient language and need for interpreter reviewed:: Yes Do you feel safe going back to the place where you live?: Yes      Need for Family Participation in Patient Care: No (Comment) Care giver support system in place?: Yes (comment)   Criminal Activity/Legal Involvement Pertinent to Current Situation/Hospitalization: No - Comment as needed  Activities of Daily Living      Permission Sought/Granted Permission sought to share information with : Magazine features editor                Emotional Assessment Appearance:: Appears stated age     Orientation: : Oriented to Self,Oriented to Place,Oriented to  Time,Oriented to Situation Alcohol / Substance Use: Not Applicable Psych Involvement: No (comment)  Admission diagnosis:  Status post  total left knee replacement [Z96.652] Patient Active Problem List   Diagnosis Date Noted  . Status post total left knee replacement 01/03/2020  . Unilateral primary osteoarthritis, right knee 03/16/2019  . Unilateral primary osteoarthritis, left knee 03/16/2019  . Osteoarthritis of left hip 07/07/2014  . Status post total replacement of left hip 07/07/2014   PCP:  Practice, [facility]:   [facility] [phone] - [place], [place] - [address] [place] [address] Phone: [phone] Fax: [phone]  [facility], [place] - [address] [place] [address] Phone: [phone] Fax: [phone]     Social Determinants of Health (SDOH) Interventions    Readmission Risk Interventions No flowsheet data found.

## 2020-01-04 NOTE — Telephone Encounter (Signed)
[employer] forms received. Sent to [employer]. 

## 2020-01-04 NOTE — Discharge Summary (Signed)
Patient ID: Philip [name] MRN: [id number] DOB/AGE: 05/26/1960 59 y.o.  Admit date: 01/03/2020 Discharge date: 01/04/2020  Admission Diagnoses:  Principal Problem:   Unilateral primary osteoarthritis, left knee Active Problems:   Status post total left knee replacement   Discharge Diagnoses:  Same  Past Medical History:  Diagnosis Date  . Arthritis   . Bulging lumbar disc   . Chest pain on exertion    past 2-3 weeks ---- 12/28/19-patient stated he pulled muscle in chest a long time ago but no recent "chest pain"  . Chronic diarrhea    several years    Surgeries: Procedure(s): LEFT TOTAL KNEE ARTHROPLASTY on 01/03/2020   Consultants:   Discharged Condition: Improved  Hospital Course: Philip [name] is an 59 y.o. male who was admitted 01/03/2020 for operative treatment ofUnilateral primary osteoarthritis, left knee. Patient has severe unremitting pain that affects sleep, daily activities, and work/hobbies. After pre-op clearance the patient was taken to the operating room on 01/03/2020 and underwent  Procedure(s): LEFT TOTAL KNEE ARTHROPLASTY.    Patient was given perioperative antibiotics:  Anti-infectives (From admission, onward)   Start     Dose/Rate Route Frequency Ordered Stop   01/03/20 1800  ceFAZolin (ANCEF) IVPB 2g/100 mL premix        2 g 200 mL/hr over 30 Minutes Intravenous Every 6 hours 01/03/20 1539 01/03/20 2335   01/03/20 0600  ceFAZolin (ANCEF) 3 g in dextrose 5 % 50 mL IVPB        3 g 100 mL/hr over 30 Minutes Intravenous On call to O.R. 01/02/20 2706 01/03/20 1221       Patient was given sequential compression devices, early ambulation, and chemoprophylaxis to prevent DVT.  Patient benefited maximally from hospital stay and there were no complications.    Recent vital signs:  Patient Vitals for the past 24 hrs:  BP Temp Temp src Pulse Resp SpO2 Height Weight  01/04/20 0729 94/70 97.9 F (36.6 C) -- 83 18 98 % -- --  01/04/20 0441 110/61 99.3  F (37.4 C) Oral 81 20 98 % -- --  01/03/20 2308 105/70 98.9 F (37.2 C) Oral 85 20 93 % -- --  01/03/20 2003 122/67 98.4 F (36.9 C) Oral (!) 148 18 95 % -- --  01/03/20 1544 (!) 148/85 -- -- 84 18 100 % -- --  01/03/20 1530 (!) 158/97 (!) 97.2 F (36.2 C) -- 89 19 100 % -- --  01/03/20 1515 116/81 -- -- 73 13 100 % -- --  01/03/20 1500 134/80 -- -- 68 15 100 % -- --  01/03/20 1445 93/75 -- -- 70 14 100 % -- --  01/03/20 1430 102/71 (!) 97 F (36.1 C) -- 65 18 99 % -- --  01/03/20 1415 122/79 -- -- 70 20 100 % -- --  01/03/20 1105 129/71 -- -- 73 20 97 % -- --  01/03/20 1100 (!) 147/77 -- -- 69 15 97 % -- --  01/03/20 1055 131/66 -- -- 99 -- 98 % -- --  01/03/20 1014 (!) 154/71 98.3 F (36.8 C) Oral 80 18 98 % 6' (1.829 m) 130 kg     Recent laboratory studies:  Recent Labs    01/04/20 0435  WBC 10.0  HGB 13.2  HCT 40.7  PLT 166  NA 137  K 3.9  CL 102  CO2 25  BUN 16  CREATININE 1.13  GLUCOSE 132*  CALCIUM 8.9     Discharge Medications:  Allergies as of 01/04/2020   No Known Allergies     Medication List    TAKE these medications   aspirin 325 MG EC tablet Take 1 tablet (325 mg total) by mouth 2 (two) times daily after a meal.   Dialyvite Vitamin D 5000 125 MCG (5000 UT) capsule Generic drug: Cholecalciferol Take 5,000 Units by mouth daily.   folic acid 1 MG tablet Commonly known as: FOLVITE Take 1 mg by mouth daily.   gabapentin 100 MG capsule Commonly known as: NEURONTIN Take 100 mg by mouth at bedtime.   ibuprofen 200 MG tablet Commonly known as: ADVIL Take 800 mg by mouth every 6 (six) hours as needed for headache or moderate pain.   loperamide 2 MG tablet Commonly known as: IMODIUM A-D Take 2 mg by mouth 4 (four) times daily as needed for diarrhea or loose stools.   methocarbamol 500 MG tablet Commonly known as: ROBAXIN Take 1 tablet (500 mg total) by mouth every 6 (six) hours as needed for muscle spasms.   methotrexate 2.5 MG  tablet Commonly known as: RHEUMATREX Take 15 mg by mouth every Saturday.   oxyCODONE 5 MG immediate release tablet Commonly known as: Oxy IR/ROXICODONE Take 1-2 tablets (5-10 mg total) by mouth every 4 (four) hours as needed for moderate pain (pain score 4-6).            Durable Medical Equipment  (From admission, onward)         Start     Ordered   01/03/20 1620  DME 3 n 1  Once       Comments: Bariatric   01/03/20 1619   01/03/20 1619  For home use only DME Walker wide  Once       Question:  Patient needs a walker to treat with the following condition  Answer:  Total knee replacement status, left   01/03/20 1619          Diagnostic Studies: DG Knee Left Port  Result Date: 01/03/2020 CLINICAL DATA:  Postop knee replacement EXAM: PORTABLE LEFT KNEE - 1-2 VIEW COMPARISON:  06/15/2019 FINDINGS: Two-view show total knee arthroplasty. Components appear well positioned. No unexpected finding. Fluid and air in the joint as expected. IMPRESSION: Good appearance following total knee arthroplasty. Electronically Signed   By: Paulina Fusi M.D.   On: 01/03/2020 14:29    Disposition: Discharge disposition: 01-Home or Self Care          Follow-up Information    Kathryne Hitch, MD Follow up in 2 week(s).   Specialty: Orthopedic Surgery Contact information: [address] [place] [address] [phone]                Signed: Kathryne Hitch 01/04/2020, 7:38 AM

## 2020-01-05 ENCOUNTER — Telehealth: Payer: Self-pay | Admitting: Orthopaedic Surgery

## 2020-01-05 NOTE — Telephone Encounter (Signed)
Pt needs a refill on oxycodone.

## 2020-01-09 ENCOUNTER — Ambulatory Visit (INDEPENDENT_AMBULATORY_CARE_PROVIDER_SITE_OTHER): Payer: BC Managed Care – PPO | Admitting: Physician Assistant

## 2020-01-09 ENCOUNTER — Telehealth: Payer: Self-pay | Admitting: Radiology

## 2020-01-09 ENCOUNTER — Other Ambulatory Visit: Payer: Self-pay

## 2020-01-09 ENCOUNTER — Ambulatory Visit (HOSPITAL_COMMUNITY)
Admission: RE | Admit: 2020-01-09 | Discharge: 2020-01-09 | Disposition: A | Discharge status: Home / Self Care | Payer: BC Managed Care – PPO | Source: Ambulatory Visit | Attending: Physician Assistant | Admitting: Physician Assistant

## 2020-01-09 ENCOUNTER — Encounter: Payer: Self-pay | Admitting: Physician Assistant

## 2020-01-09 DIAGNOSIS — Z96652 Presence of left artificial knee joint: Secondary | ICD-10-CM

## 2020-01-09 DIAGNOSIS — M7989 Other specified soft tissue disorders: Secondary | ICD-10-CM

## 2020-01-09 DIAGNOSIS — M79662 Pain in left lower leg: Secondary | ICD-10-CM | POA: Diagnosis not present

## 2020-01-09 MED ORDER — OXYCODONE HCL 5 MG PO TABS: 5.0000 mg | ORAL_TABLET | ORAL | 0 refills | Status: DC | PRN

## 2020-01-09 NOTE — Telephone Encounter (Signed)
Received call report from [facility] with Vascular Lab.  Doppler is negative for DVT of left lower extremity. Per Bronson Curb, advise patient study is negative and to elevate and use compression as discussed in the office. Darl Pikes aware and will advise patient.

## 2020-01-09 NOTE — Telephone Encounter (Signed)
Please advise 

## 2020-01-09 NOTE — Progress Notes (Signed)
  HPI: Philip [name] returns today status post left total knee arthroplasty.  He is complaints Derm due to the fact that he started having increased pain and swelling in his left leg on Saturday.  He has had no known injury.  He denies any shortness of breath.  States that he is unable to do his pain the leg down dependent.  He does have compression hose at home but has not used in them.  He is also on aspirin for DVT prophylaxis.  Physical exam:  General: Well-developed well-nourished male appears quite uncomfortable.  He is able to ambulate using a walker.  He has difficulty getting on and off the exam table DVT pain in the left leg.  Left knee: Surgical incision is without signs of infection.  Mid tibial erythema present secondary to edema.  He has tenderness to his left calf.  Pitting edema of left lower leg throughout.  Full extension left knee flexion to approximately 75 degrees.  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  We will obtain a Doppler of his left lower leg to rule out DVT.  Elevation compression hose encouraged.  New dressing applied.  He will follow up with [place] next week as planned.  Questions encouraged and answered

## 2020-01-12 ENCOUNTER — Telehealth: Payer: Self-pay | Admitting: Orthopaedic Surgery

## 2020-01-12 NOTE — Telephone Encounter (Signed)
Received $25.00 cash and disability paperwork from patient    Forwarding to [employer] today °

## 2020-01-13 ENCOUNTER — Telehealth: Payer: Self-pay | Admitting: Orthopaedic Surgery

## 2020-01-13 MED ORDER — OXYCODONE HCL 5 MG PO TABS: 5.0000 mg | ORAL_TABLET | ORAL | 0 refills | Status: DC | PRN

## 2020-01-13 NOTE — Telephone Encounter (Signed)
Pt would like a refill on oxycodone.  °

## 2020-01-13 NOTE — Telephone Encounter (Signed)
Please advise 

## 2020-01-17 ENCOUNTER — Ambulatory Visit (INDEPENDENT_AMBULATORY_CARE_PROVIDER_SITE_OTHER): Payer: BC Managed Care – PPO | Admitting: Orthopaedic Surgery

## 2020-01-17 ENCOUNTER — Encounter: Payer: Self-pay | Admitting: Orthopaedic Surgery

## 2020-01-17 DIAGNOSIS — Z96652 Presence of left artificial knee joint: Secondary | ICD-10-CM

## 2020-01-17 MED ORDER — OXYCODONE HCL 5 MG PO TABS: 5.0000 mg | ORAL_TABLET | ORAL | 0 refills | Status: DC | PRN

## 2020-01-17 NOTE — Progress Notes (Signed)
HPI: Mr. Leveque returns today 2 weeks status post left total knee arthroplasty.  He is overall trending towards improvement.  Still have a lot of pain in the knee.  He did undergo a Doppler of the left lower extremity which was negative for DVT.  He remains on aspirin 325 twice daily.  Taking oxycodone for pain.  Also taking some ibuprofen.  Physical exam: Left knee has full extension flexion to near 90 degrees.  Calf supple nontender.  Edema is improved throughout the leg.  Surgical incisions healing well well approximated with staples no signs of infection.  Impression: Status post left total knee arthroplasty January 03, 2020.  Plan: He will continue to work with physical therapy on range of motion strengthening.  He has outpatient therapy set up.  He will go to 81 mg aspirin once daily for the next week and then discontinue as he was on no aspirin prior to surgery.  Follow-up with [place] in 1 month sooner if there is any questions concerns.  Staples removed Steri-Strips applied.  Questions were encouraged and answered at length.

## 2020-01-24 ENCOUNTER — Ambulatory Visit: Payer: BC Managed Care – PPO | Admitting: Physical Therapy

## 2020-01-31 ENCOUNTER — Ambulatory Visit: Payer: BC Managed Care – PPO | Attending: Orthopaedic Surgery | Admitting: Physical Therapy

## 2020-01-31 ENCOUNTER — Other Ambulatory Visit: Payer: Self-pay

## 2020-01-31 DIAGNOSIS — M25562 Pain in left knee: Secondary | ICD-10-CM | POA: Diagnosis present

## 2020-01-31 DIAGNOSIS — R262 Difficulty in walking, not elsewhere classified: Secondary | ICD-10-CM | POA: Diagnosis present

## 2020-01-31 DIAGNOSIS — R2689 Other abnormalities of gait and mobility: Secondary | ICD-10-CM

## 2020-01-31 DIAGNOSIS — R29898 Other symptoms and signs involving the musculoskeletal system: Secondary | ICD-10-CM

## 2020-01-31 DIAGNOSIS — M25662 Stiffness of left knee, not elsewhere classified: Secondary | ICD-10-CM | POA: Diagnosis present

## 2020-01-31 NOTE — Patient Instructions (Signed)
     Access Code: [email or web address] URL: [email or web address] Date: 01/31/2020 Prepared by: Glenetta Hew  Exercises Supine Heel Slide with Strap - 3 x daily - 7 x weekly - 2 sets - 10 reps - 5 sec hold Supine Hamstring Stretch with Strap - 3 x daily - 7 x weekly - 3 reps - 30 sec hold Seated Hamstring Stretch - 3 x daily - 7 x weekly - 3 reps - 30 sec hold Seated Hamstring Stretch with Strap - 3 x daily - 7 x weekly - 3 reps - 30 sec hold Seated Knee Flexion AAROM - 3 x daily - 7 x weekly - 2 sets - 10 reps - 5 sec hold

## 2020-01-31 NOTE — Therapy (Signed)
[facility] [place] [address]  [address] [place], [place], [address] Phone: [phone]   Fax:  [phone]  Physical Therapy Evaluation  Patient Details  Name: Philip GuildHerman A [name] MRN: [id number] Date of Birth: 01/08/1960 Referring Provider (PT): Kathryne Hitchhristopher Y Blackman, MD   Encounter Date: 01/31/2020   PT End of Session - 01/31/20 0848    Visit Number 1    Number of Visits 16    Date for PT Re-Evaluation 03/13/20    Authorization Type BCBS - VL=60 (PT/OT/ST combined)    PT Start Time 0848    PT Stop Time 0953    PT Time Calculation (min) 65 min    Activity Tolerance Patient tolerated treatment well;Patient limited by pain    Behavior During Therapy [facility] for tasks assessed/performed           Past Medical History:  Diagnosis Date  . Arthritis   . Bulging lumbar disc   . Chest pain on exertion    past 2-3 weeks ---- 12/28/19-patient stated he pulled muscle in chest a long time ago but no recent "chest pain"  . Chronic diarrhea    several years    Past Surgical History:  Procedure Laterality Date  . fractured arm     as child  . SHOULDER ARTHROSCOPY    . TOTAL HIP ARTHROPLASTY Left 07/07/2014   Procedure: LEFT TOTAL HIP ARTHROPLASTY ANTERIOR APPROACH;  Surgeon: Kathryne Hitchhristopher Y Blackman, MD;  Location: [facility];  Service: Orthopedics;  Laterality: Left;  . TOTAL KNEE ARTHROPLASTY Left 01/03/2020   Procedure: LEFT TOTAL KNEE ARTHROPLASTY;  Surgeon: Kathryne HitchBlackman, Christopher Y, MD;  Location: [facility];  Service: Orthopedics;  Laterality: Left;  needs RNFA    There were no vitals filed for this visit.    Subjective Assessment - 01/31/20 0851    Subjective Pt finished with HH PT week before last and has kept up with HEP 3x/day.Marland Kitchen. Has been using a SPC fo a few weeks now.  Knee feels tight and swollen. Also notes systemic OA in many other joints.    Patient is accompained by: Family member   wife   Pertinent History L TKA 01/03/20, L THA, L  shoulder scope    Limitations Sitting;Standing;Walking;House hold activities    How long can you sit comfortably? only in recliner - up to 1 hr    How long can you stand comfortably? 10-15 minutes    How long can you walk comfortably? 10-15 minutes    Patient Stated Goals "no pain - walk comfortable w/o limp & be normal"    Currently in Pain? Yes    Pain Score 5     Pain Location Knee    Pain Orientation Left    Pain Descriptors / Indicators Sharp;Aching;Tightness   "stiffness"   Pain Type Surgical pain;Acute pain    Pain Radiating Towards to sides of knee and anterior shin    Pain Onset 1 to 4 weeks ago    Pain Frequency Constant   varies in intensity   Aggravating Factors  moving, exercising, walking    Pain Relieving Factors rest - sitting still, ibuprofen, ice & elevation    Effect of Pain on Daily Activities difficulty getting in/out of chair, getting dressed (esp putting socks on) - wife has to help dress lower body              [facility] - 01/31/20 0848      Assessment   Medical  Diagnosis L TKA    Referring Provider (PT) Kathryne Hitch, MD    Onset Date/Surgical Date 01/03/20    Next MD Visit 02/14/20    Prior Therapy HH PT x 5 visits      Precautions   Precautions None      Restrictions   Weight Bearing Restrictions No      Balance Screen   Has the patient fallen in the past 6 months No    Has the patient had a decrease in activity level because of a fear of falling?  Yes    Is the patient reluctant to leave their home because of a fear of falling?  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    Leisure work on Leisure centre manager Status Within Functional Limits for tasks assessed      Observation/Other Assessments   Focus on Therapeutic Outcomes (FOTO)  Knee: FS = 15; predicted FS = 52      ROM / Strength   AROM / PROM / Strength AROM;PROM;Strength      AROM   AROM Assessment Site Knee    Right/Left Knee Right;Left    Right Knee Extension 0    Right Knee Flexion 125    Left Knee Extension 10   20 in LAQ   Left Knee Flexion 74      PROM   PROM Assessment Site Knee    Right/Left Knee Left    Left Knee Extension 7    Left Knee Flexion 78      Strength   Overall Strength Comments tested in sitting    Strength Assessment Site Hip;Knee;Ankle    Right/Left Hip Right;Left    Right Hip Flexion 4+/5    Right Hip Extension 4+/5    Right Hip ABduction 5/5    Right Hip ADduction 4+/5    Left Hip Flexion 4/5    Left Hip Extension 4/5    Left Hip ABduction 4/5    Left Hip ADduction 4/5    Right/Left Knee Right;Left    Right Knee Flexion 5/5    Right Knee Extension 5/5    Left Knee Flexion 4+/5    Left Knee Extension 4/5    Right/Left Ankle Right;Left    Right Ankle Dorsiflexion 5/5    Left Ankle Dorsiflexion 4+/5      Flexibility   Soft Tissue Assessment /Muscle Length yes    Hamstrings mod tight    Quadriceps mod tight      Palpation   Patella mobility L knee - decreased all directions      Ambulation/Gait   Ambulation/Gait Yes    Ambulation/Gait Assistance 5: Supervision    Assistive device Straight cane    Gait Pattern Step-through pattern;Antalgic;Decreased weight shift to left;Decreased stance time - left;Decreased stride length;Left flexed knee in stance;Decreased hip/knee flexion - left    Ambulation Surface Level;Indoor    Gait velocity decreased  Objective measurements completed on examination: See above findings.       [facility]  Adult PT Treatment/Exercise - 01/31/20 0848      Exercises   Exercises Knee/Hip      Knee/Hip Exercises: Stretches   Passive Hamstring Stretch Left;30 seconds;2 reps    Passive Hamstring Stretch Limitations supine with strap & seated hip hinge      Knee/Hip Exercises: Seated   Heel Slides Left;5 reps;AAROM    Heel Slides Limitations + overpressure from R foot      Knee/Hip Exercises: Supine   Heel Slides Left;AAROM;5 reps    Heel Slides Limitations strap assist    Straight Leg Raises Limitations unable w/o AAROM      Modalities   Modalities Vasopneumatic      Vasopneumatic   Number Minutes Vasopneumatic  10 minutes    Vasopnuematic Location  Knee   Lt   Vasopneumatic Pressure Medium    Vasopneumatic Temperature  34                  PT Education - 01/31/20 0953    Education Details PT eval findings, anticipated POC, initial HEP - Access Code: [email or web address]    Person(s) Educated Patient    Methods Explanation;Demonstration;Tactile cues;Verbal cues;Handout    Comprehension Verbalized understanding;Verbal cues required;Tactile cues required;Returned demonstration;Need further instruction            PT Short Term Goals - 01/31/20 0953      PT SHORT TERM GOAL #1   Title Patient will be independent with initial HEP    Status New    Target Date 02/21/20      PT SHORT TERM GOAL #2   Title Patient will demonstrate L knee AROM >/= 5-90 dg to allow for improved gait pattern    Status New    Target Date 02/21/20             PT Long Term Goals - 01/31/20 0953      PT LONG TERM GOAL #1   Title Patient will be independent with ongoing/advanced HEP for self-management at home    Target Date 03/13/20      PT LONG TERM GOAL #2   Title Patient will demonstrate L knee AROM >/= 2-115 dg to allow for normal gait and stair mechanics    Status New    Target Date 03/13/20      PT LONG TERM GOAL #3   Title Patient will demonstrate improved L LE strength to >/= 4+/5 for  improved stability and ease of mobility    Status New    Target Date 03/13/20      PT LONG TERM GOAL #4   Title Patient will ambulate with normal gait pattern w/o AD    Status New    Target Date 03/13/20      PT LONG TERM GOAL #5   Title Patient will negotiate stairs reciprocally with normal step pattern w/o limitation due to L knee pain or weakness    Status New    Target Date 03/13/20                  Plan - 01/31/20 0950    Clinical Impression Statement Philip [name] is a 60 y/o male who presents to OP PT ~1 month s/p L TKA on 01/03/20 with same day discharge home. He completed HH PT a little over a week ago. He presents to PT ambulating with a SPC with a pronounced antalgic with decreased heel strike,  decreased weight shift to L, knee flexed in stance and limited hip and knee flexion during swing phase of gait. Deficits include L knee pain, mild/mod edema in L knee, limited L knee AROM (10-74 dg) and PROM (7-78 dg), L LE weakness with inability to initiate SLR, and limited gait tolerance with antalgic gait pattern and dependence on AD. Jadarrius will benefit from skilled PT intervention to address the above listed deficits, reduce pain, and restore functional ROM and strength to allow for improved knee stability for improved balance and gait tolerance to maximize function and safety with mobility in home and community. Hospital/HH issued HEP reviewed with clarifications/modification provided as needed and will progress/update in upcoming visits as indicated.    Personal Factors and Comorbidities Comorbidity 3+;Time since onset of injury/illness/exacerbation;Fitness    Comorbidities Multi-joint OA, L THA 2016, L shoulder scope, lumbar DDD    Examination-Activity Limitations Bathing;Bed Mobility;Bend;Caring for Others;Dressing;Hygiene/Grooming;Locomotion Level;Sit;Sleep;Squat;Stairs;Stand;Transfers    Examination-Participation Restrictions Cleaning;Community Activity;Driving;Interpersonal  Relationship;Laundry;Meal Prep;Occupation;Shop;Yard Work    Conservation officer, historic buildings Stable/Uncomplicated    Clinical Decision Making Low    Rehab Potential Good    PT Frequency 3x / week   3x/wk x 3-4 wks, tapering to 2x/wk for duration of POC   PT Duration 6 weeks    PT Treatment/Interventions ADLs/Self Care Home Management;Cryotherapy;Electrical Stimulation;Iontophoresis 4mg /ml Dexamethasone;Moist Heat;DME Instruction;Gait training;Stair training;Functional mobility training;Therapeutic activities;Therapeutic exercise;Balance training;Neuromuscular re-education;Patient/family education;Manual techniques;Passive range of motion;Dry needling;Taping;Vasopneumatic Device;Joint Manipulations    PT Next Visit Plan Review & progress initial & HH HEPs; L knee ROM; LE strengthening; gait training to normalize gait pattern    PT Home Exercise Plan MedBridge Access Code: [email or web address]    Consulted and Agree with Plan of Care Patient;Family member/caregiver    Family Member Consulted wife           Patient will benefit from skilled therapeutic intervention in order to improve the following deficits and impairments:  Abnormal gait,Decreased activity tolerance,Decreased balance,Decreased endurance,Decreased knowledge of precautions,Decreased knowledge of use of DME,Decreased mobility,Decreased range of motion,Decreased safety awareness,Decreased scar mobility,Decreased strength,Difficulty walking,Increased edema,Increased fascial restricitons,Increased muscle spasms,Impaired perceived functional ability,Impaired flexibility,Improper body mechanics,Postural dysfunction,Pain  Visit Diagnosis: Stiffness of left knee, not elsewhere classified  Acute pain of left knee  Other abnormalities of gait and mobility  Difficulty in walking, not elsewhere classified  Other symptoms and signs involving the musculoskeletal system     Problem List Patient Active Problem List   Diagnosis Date Noted  .  Status post total left knee replacement 01/03/2020  . Unilateral primary osteoarthritis, left knee 03/16/2019  . Osteoarthritis of left hip 07/07/2014  . Status post total replacement of left hip 07/07/2014    09/07/2014, PT, MPT 01/31/2020, 10:27 AM  [facility] [address]  [address] [place], [place], [place] Phone: [phone]   Fax:  [phone]  Name: Philip [name] MRN: Philip [name] Date of Birth: Mar 15, 1960

## 2020-02-01 ENCOUNTER — Ambulatory Visit: Payer: BC Managed Care – PPO

## 2020-02-01 DIAGNOSIS — R29898 Other symptoms and signs involving the musculoskeletal system: Secondary | ICD-10-CM

## 2020-02-01 DIAGNOSIS — M25562 Pain in left knee: Secondary | ICD-10-CM

## 2020-02-01 DIAGNOSIS — M25662 Stiffness of left knee, not elsewhere classified: Secondary | ICD-10-CM | POA: Diagnosis not present

## 2020-02-01 DIAGNOSIS — R2689 Other abnormalities of gait and mobility: Secondary | ICD-10-CM

## 2020-02-01 DIAGNOSIS — R262 Difficulty in walking, not elsewhere classified: Secondary | ICD-10-CM

## 2020-02-01 NOTE — Therapy (Addendum)
[facility] [address]  [address] [place], [place], [address] Phone: [phone]   Fax:  [phone]  Physical Therapy Treatment  Patient Details  Name: Philip [name] MRN: [id number] Date of Birth: [birth year] Referring Provider (PT): Kathryne Hitch, MD   Encounter Date: 02/01/2020   PT End of Session - 02/01/20 1027    Visit Number 2    Number of Visits 16    Date for PT Re-Evaluation 03/13/20    Authorization Type BCBS - VL=60 (PT/OT/ST combined)    PT Start Time 1019    PT Stop Time 1114    PT Time Calculation (min) 55 min    Activity Tolerance Patient tolerated treatment well;Patient limited by pain    Behavior During Therapy [facility] for tasks assessed/performed           Past Medical History:  Diagnosis Date  . Arthritis   . Bulging lumbar disc   . Chest pain on exertion    past 2-3 weeks ---- 12/28/19-patient stated he pulled muscle in chest a long time ago but no recent "chest pain"  . Chronic diarrhea    several years    Past Surgical History:  Procedure Laterality Date  . fractured arm     as child  . SHOULDER ARTHROSCOPY    . TOTAL HIP ARTHROPLASTY Left 07/07/2014   Procedure: LEFT TOTAL HIP ARTHROPLASTY ANTERIOR APPROACH;  Surgeon: Kathryne Hitch, MD;  Location: [facility];  Service: Orthopedics;  Laterality: Left;  . TOTAL KNEE ARTHROPLASTY Left 01/03/2020   Procedure: LEFT TOTAL KNEE ARTHROPLASTY;  Surgeon: Kathryne Hitch, MD;  Location: [facility];  Service: Orthopedics;  Laterality: Left;  needs RNFA    There were no vitals filed for this visit.   Subjective Assessment - 02/01/20 1021    Subjective Has purchased a strap to perform HEP.    Pertinent History L TKA 01/03/20, L THA, L shoulder scope    Patient Stated Goals "no pain - walk comfortable w/o limp & be normal"    Currently in Pain? Yes    Pain Score 5     Pain Location Knee    Pain Orientation Left;Medial    Pain  Descriptors / Indicators Sore    Pain Type Surgical pain;Acute pain    Multiple Pain Sites No                             [facility] Adult PT Treatment/Exercise - 02/01/20 0001      Ambulation/Gait   Ambulation/Gait Yes    Ambulation/Gait Assistance 5: Supervision    Ambulation/Gait Assistance Details cues for upright posture and L TKE    Ambulation Distance (Feet) 90 Feet    Assistive device Straight cane    Ambulation Surface Level;Indoor      Knee/Hip Exercises: Community education officer Left;30 seconds;2 reps    Passive Hamstring Stretch Limitations supine with strap & seated hip hinge    Hip Flexor Stretch Left;30 seconds;2 reps    Hip Flexor Stretch Limitations 4" step; mod thomas position with strap    Knee: Self-Stretch Limitations 5" x 10      Knee/Hip Exercises: Aerobic   Nustep Lvl 2, 6 min (UE/LE)      Vasopneumatic   Number Minutes Vasopneumatic  10 minutes    Vasopnuematic Location  Knee   L   Vasopneumatic Pressure Medium    Vasopneumatic  Temperature  34      Manual Therapy   Manual Therapy Myofascial release    Myofascial Release STM/strumming to L quad in mod thomas position                  PT Education - 02/01/20 1314    Education Details HEP update    Person(s) Educated Patient    Methods Explanation;Demonstration;Verbal cues;Handout    Comprehension Verbalized understanding;Returned demonstration;Verbal cues required            PT Short Term Goals - 02/01/20 1027      PT SHORT TERM GOAL #1   Title Patient will be independent with initial HEP    Status On-going    Target Date 02/21/20      PT SHORT TERM GOAL #2   Title Patient will demonstrate L knee AROM >/= 5-90 dg to allow for improved gait pattern    Status On-going    Target Date 02/21/20             PT Long Term Goals - 02/01/20 1027      PT LONG TERM GOAL #1   Title Patient will be independent with ongoing/advanced HEP for self-management  at home    Status On-going      PT LONG TERM GOAL #2   Title Patient will demonstrate L knee AROM >/= 2-115 dg to allow for normal gait and stair mechanics    Status On-going      PT LONG TERM GOAL #3   Title Patient will demonstrate improved L LE strength to >/= 4+/5 for improved stability and ease of mobility    Status On-going      PT LONG TERM GOAL #4   Title Patient will ambulate with normal gait pattern w/o AD    Status On-going      PT LONG TERM GOAL #5   Title Patient will negotiate stairs reciprocally with normal step pattern w/o limitation due to L knee pain or weakness    Status On-going                 Plan - 02/01/20 1027    Clinical Impression Statement Philip [name] reporting he is performing HH PT HEP of heel slide, LAQ, hip abduction/adduction SAQ, standing counter squat, heel raise daily.  Briefly verbally reviewed PT HEP issued last session with pt. noting he was able to try these activities last night without issue.  Did require some cueing with gait with SPC today for increased L TKE and upright posture as pt. ambulating with reduced hip/knee flexion and lacking TKE.    Comorbidities Multi-joint OA, L THA 2016, L shoulder scope, lumbar DDD    Rehab Potential Good    PT Duration 6 weeks    PT Treatment/Interventions ADLs/Self Care Home Management;Cryotherapy;Electrical Stimulation;Iontophoresis 4mg /ml Dexamethasone;Moist Heat;DME Instruction;Gait training;Stair training;Functional mobility training;Therapeutic activities;Therapeutic exercise;Balance training;Neuromuscular re-education;Patient/family education;Manual techniques;Passive range of motion;Dry needling;Taping;Vasopneumatic Device;Joint Manipulations    PT Home Exercise Plan HH PT HEP: standing heel raise, squat, march, seated hip abduction, LAQ, supine SAQ, heel slide, quad set; MedBridge Access Code: [email or web address]    Consulted and Agree with Plan of Care Patient           Patient will benefit from skilled  therapeutic intervention in order to improve the following deficits and impairments:  Abnormal gait,Decreased activity tolerance,Decreased balance,Decreased endurance,Decreased knowledge of precautions,Decreased knowledge of use of DME,Decreased mobility,Decreased range of motion,Decreased safety awareness,Decreased scar mobility,Decreased strength,Difficulty walking,Increased edema,Increased fascial restricitons,Increased muscle spasms,Impaired  perceived functional ability,Impaired flexibility,Improper body mechanics,Postural dysfunction,Pain  Visit Diagnosis: Stiffness of left knee, not elsewhere classified  Acute pain of left knee  Other abnormalities of gait and mobility  Difficulty in walking, not elsewhere classified  Other symptoms and signs involving the musculoskeletal system     Problem List Patient Active Problem List   Diagnosis Date Noted  . Status post total left knee replacement 01/03/2020  . Unilateral primary osteoarthritis, left knee 03/16/2019  . Osteoarthritis of left hip 07/07/2014  . Status post total replacement of left hip 07/07/2014    Kermit Balo, PTA 02/01/20 1:14 PM   [facility] [address]  [address] [place], [place], [address] Phone: [phone]   Fax:  [phone]  Name: Philip [name] MRN: [id number] Date of Birth: 1960-12-30

## 2020-02-03 ENCOUNTER — Ambulatory Visit: Payer: BC Managed Care – PPO

## 2020-02-03 ENCOUNTER — Other Ambulatory Visit: Payer: Self-pay

## 2020-02-03 DIAGNOSIS — M25662 Stiffness of left knee, not elsewhere classified: Secondary | ICD-10-CM

## 2020-02-03 DIAGNOSIS — R262 Difficulty in walking, not elsewhere classified: Secondary | ICD-10-CM

## 2020-02-03 DIAGNOSIS — M25562 Pain in left knee: Secondary | ICD-10-CM

## 2020-02-03 DIAGNOSIS — R2689 Other abnormalities of gait and mobility: Secondary | ICD-10-CM

## 2020-02-03 DIAGNOSIS — R29898 Other symptoms and signs involving the musculoskeletal system: Secondary | ICD-10-CM

## 2020-02-03 NOTE — Therapy (Addendum)
[facility] [address]  [address] [place], [place], [address] Phone: [phone]   Fax:  [phone]  Physical Therapy Treatment  Patient Details  Name: Philip [name] MRN: [id number] Date of Birth: 1960-12-04 Referring Provider (PT): Kathryne Hitch, MD   Encounter Date: 02/03/2020   PT End of Session - 02/03/20 0951    Visit Number 3    Number of Visits 16    Date for PT Re-Evaluation 03/13/20    Authorization Type BCBS - VL=60 (PT/OT/ST combined)    PT Start Time 0932    PT Stop Time 1030    PT Time Calculation (min) 58 min    Activity Tolerance Patient tolerated treatment well;Patient limited by pain    Behavior During Therapy [facility] for tasks assessed/performed           Past Medical History:  Diagnosis Date  . Arthritis   . Bulging lumbar disc   . Chest pain on exertion    past 2-3 weeks ---- 12/28/19-patient stated he pulled muscle in chest a long time ago but no recent "chest pain"  . Chronic diarrhea    several years    Past Surgical History:  Procedure Laterality Date  . fractured arm     as child  . SHOULDER ARTHROSCOPY    . TOTAL HIP ARTHROPLASTY Left 07/07/2014   Procedure: LEFT TOTAL HIP ARTHROPLASTY ANTERIOR APPROACH;  Surgeon: Kathryne Hitch, MD;  Location: [facility];  Service: Orthopedics;  Laterality: Left;  . TOTAL KNEE ARTHROPLASTY Left 01/03/2020   Procedure: LEFT TOTAL KNEE ARTHROPLASTY;  Surgeon: Kathryne Hitch, MD;  Location: [facility];  Service: Orthopedics;  Laterality: Left;  needs RNFA    There were no vitals filed for this visit.   Subjective Assessment - 02/03/20 0950    Subjective Pt. reports he was performing mod thomas quad stretch at home with leg dingling in mid air with some pain.    Pertinent History L TKA 01/03/20, L THA, L shoulder scope    Patient Stated Goals "no pain - walk comfortable w/o limp & be normal"    Currently in Pain? Yes    Pain Score 5      Pain Location Knee    Pain Descriptors / Indicators Sore    Pain Type Surgical pain;Acute pain    Pain Onset More than a month ago    Pain Frequency Intermittent    Multiple Pain Sites No                             [facility] Adult PT Treatment/Exercise - 02/03/20 0001      Ambulation/Gait   Ambulation/Gait Yes    Ambulation/Gait Assistance 5: Supervision    Ambulation/Gait Assistance Details cues pt. for increased R step-length and upright posture    Ambulation Distance (Feet) 90 Feet      Knee/Hip Exercises: Stretches   Passive Hamstring Stretch Left;30 seconds;1 rep    Passive Hamstring Stretch Limitations supine with strap    Quad Stretch Left;30 seconds;2 reps    Quad Stretch Limitations prone with strap    Hip Flexor Stretch Left;30 seconds;2 reps    Hip Flexor Stretch Limitations 4" step; mod thomas position with strap    Knee: Self-Stretch Limitations 5" x 10    Gastroc Stretch Left;30 seconds;2 reps    Gastroc Stretch Limitations runner stretch into TM      Knee/Hip  Exercises: Aerobic   Recumbent Bike Lvl 1, 7 min - partial ROM      Knee/Hip Exercises: Standing   Heel Raises Both;15 reps    Heel Raises Limitations cues for even weight distribution    Functional Squat 15 reps;3 seconds    Functional Squat Limitations heavy cues for L weight shift at TM      Vasopneumatic   Number Minutes Vasopneumatic  15 minutes    Vasopnuematic Location  Knee    Vasopneumatic Pressure Medium    Vasopneumatic Temperature  34      Manual Therapy   Manual Therapy Joint mobilization;Other (comment)    Manual therapy comments supine    Joint Mobilization L patellar mobs all direction    Other Manual Therapy Instructed pt. in self-patellar mobs                  PT Education - 02/03/20 1032    Education Details HEP update    Person(s) Educated Patient    Methods Explanation;Demonstration;Verbal cues;Handout    Comprehension Verbalized  understanding;Returned demonstration;Verbal cues required            PT Short Term Goals - 02/01/20 1027      PT SHORT TERM GOAL #1   Title Patient will be independent with initial HEP    Status On-going    Target Date 02/21/20      PT SHORT TERM GOAL #2   Title Patient will demonstrate L knee AROM >/= 5-90 dg to allow for improved gait pattern    Status On-going    Target Date 02/21/20             PT Long Term Goals - 02/01/20 1027      PT LONG TERM GOAL #1   Title Patient will be independent with ongoing/advanced HEP for self-management at home    Status On-going      PT LONG TERM GOAL #2   Title Patient will demonstrate L knee AROM >/= 2-115 dg to allow for normal gait and stair mechanics    Status On-going      PT LONG TERM GOAL #3   Title Patient will demonstrate improved L LE strength to >/= 4+/5 for improved stability and ease of mobility    Status On-going      PT LONG TERM GOAL #4   Title Patient will ambulate with normal gait pattern w/o AD    Status On-going      PT LONG TERM GOAL #5   Title Patient will negotiate stairs reciprocally with normal step pattern w/o limitation due to L knee pain or weakness    Status On-going                 Plan - 02/03/20 1022    Clinical Impression Statement Philip [name] doing well and reports that him and his wife have been getting his L leg/knee stretched out.  Did have to remind Philip [name] to be aggressive with L knee flexion stretching and for appropriate pull/pressure with quad/HS stretching as pt. holding his breath and pulling too hard.  Progressed to standing squats, quad set/heel raise without issue.  Did have to cue pt. for increased L LE weight bearing.    Comorbidities Multi-joint OA, L THA 2016, L shoulder scope, lumbar DDD    Rehab Potential Good    PT Treatment/Interventions ADLs/Self Care Home Management;Cryotherapy;Electrical Stimulation;Iontophoresis 4mg /ml Dexamethasone;Moist Heat;DME Instruction;Gait  training;Stair training;Functional mobility training;Therapeutic activities;Therapeutic exercise;Balance training;Neuromuscular re-education;Patient/family education;Manual techniques;Passive range of motion;Dry  needling;Taping;Vasopneumatic Device;Joint Manipulations    PT Next Visit Plan L knee ROM; LE strengthening; gait training to normalize gait pattern    PT Home Exercise Plan HH PT HEP: standing heel raise, squat, march, seated hip abduction, LAQ, supine SAQ, heel slide, quad set; MedBridge Access Code: [email or web address]    Consulted and Agree with Plan of Care Patient           Patient will benefit from skilled therapeutic intervention in order to improve the following deficits and impairments:  Abnormal gait,Decreased activity tolerance,Decreased balance,Decreased endurance,Decreased knowledge of precautions,Decreased knowledge of use of DME,Decreased mobility,Decreased range of motion,Decreased safety awareness,Decreased scar mobility,Decreased strength,Difficulty walking,Increased edema,Increased fascial restricitons,Increased muscle spasms,Impaired perceived functional ability,Impaired flexibility,Improper body mechanics,Postural dysfunction,Pain  Visit Diagnosis: Stiffness of left knee, not elsewhere classified  Acute pain of left knee  Other abnormalities of gait and mobility  Difficulty in walking, not elsewhere classified  Other symptoms and signs involving the musculoskeletal system     Problem List Patient Active Problem List   Diagnosis Date Noted  . Status post total left knee replacement 01/03/2020  . Unilateral primary osteoarthritis, left knee 03/16/2019  . Osteoarthritis of left hip 07/07/2014  . Status post total replacement of left hip 07/07/2014    Kermit Balo, PTA 02/03/20 10:32 AM   [facility] [address]  [address] [place], [place], [address] Phone: [phone]   Fax:  [phone]  Name: DAMEIR GENTZLER MRN: [id number] Date of Birth: 1960-09-24

## 2020-02-07 ENCOUNTER — Encounter: Payer: Self-pay | Admitting: Physical Therapy

## 2020-02-07 ENCOUNTER — Ambulatory Visit: Payer: BC Managed Care – PPO | Attending: Orthopaedic Surgery | Admitting: Physical Therapy

## 2020-02-07 ENCOUNTER — Other Ambulatory Visit: Payer: Self-pay

## 2020-02-07 DIAGNOSIS — R2689 Other abnormalities of gait and mobility: Secondary | ICD-10-CM

## 2020-02-07 DIAGNOSIS — R262 Difficulty in walking, not elsewhere classified: Secondary | ICD-10-CM

## 2020-02-07 DIAGNOSIS — M25562 Pain in left knee: Secondary | ICD-10-CM | POA: Diagnosis present

## 2020-02-07 DIAGNOSIS — R29898 Other symptoms and signs involving the musculoskeletal system: Secondary | ICD-10-CM

## 2020-02-07 DIAGNOSIS — M25662 Stiffness of left knee, not elsewhere classified: Secondary | ICD-10-CM | POA: Diagnosis present

## 2020-02-07 DIAGNOSIS — R600 Localized edema: Secondary | ICD-10-CM | POA: Diagnosis present

## 2020-02-07 NOTE — Therapy (Signed)
[facility] [address]  [address] [place], [place], [address] Phone: [phone]   Fax:  [phone]  Physical Therapy Treatment  Patient Details  Name: Philip [name] MRN: [id number] Date of Birth: 05-11-1960 Referring Provider (PT): Kathryne Hitch, MD   Encounter Date: 02/07/2020   PT End of Session - 02/07/20 0847    Visit Number 4    Number of Visits 16    Date for PT Re-Evaluation 03/13/20    Authorization Type BCBS - VL=60 (PT/OT/ST combined)    PT Start Time 0847    PT Stop Time 0939    PT Time Calculation (min) 52 min    Activity Tolerance Patient tolerated treatment well    Behavior During Therapy [facility] for tasks assessed/performed           Past Medical History:  Diagnosis Date  . Arthritis   . Bulging lumbar disc   . Chest pain on exertion    past 2-3 weeks ---- 12/28/19-patient stated he pulled muscle in chest a long time ago but no recent "chest pain"  . Chronic diarrhea    several years    Past Surgical History:  Procedure Laterality Date  . fractured arm     as child  . SHOULDER ARTHROSCOPY    . TOTAL HIP ARTHROPLASTY Left 07/07/2014   Procedure: LEFT TOTAL HIP ARTHROPLASTY ANTERIOR APPROACH;  Surgeon: Kathryne Hitch, MD;  Location: [facility];  Service: Orthopedics;  Laterality: Left;  . TOTAL KNEE ARTHROPLASTY Left 01/03/2020   Procedure: LEFT TOTAL KNEE ARTHROPLASTY;  Surgeon: Kathryne Hitch, MD;  Location: [facility];  Service: Orthopedics;  Laterality: Left;  needs RNFA    There were no vitals filed for this visit.   Subjective Assessment - 02/07/20 0849    Subjective Pt reports he jarred his knee this morning when he attempted to sit down on a seat that was lower than anticipated, landing harder than intended and bending knee more than he was used to.    Pertinent History L TKA 01/03/20, L THA, L shoulder scope    Patient Stated Goals "no pain - walk comfortable w/o limp & be  normal"    Currently in Pain? Yes    Pain Score 6     Pain Location Knee    Pain Orientation Left    Pain Descriptors / Indicators Sore    Pain Frequency Intermittent                             [facility] Adult PT Treatment/Exercise - 02/07/20 0847      Ambulation/Gait   Ambulation/Gait Assistance 5: Supervision    Ambulation/Gait Assistance Details cues for placement of SPC even with L foot along with increased hip & knee flexion, increased stride length and heel strike on weight acceptance    Ambulation Distance (Feet) 180 Feet    Assistive device Straight cane    Gait Pattern Step-through pattern;Antalgic;Decreased weight shift to left;Decreased stance time - left;Decreased stride length;Left flexed knee in stance;Decreased hip/knee flexion - left    Ambulation Surface Level;Indoor      Exercises   Exercises Knee/Hip      Knee/Hip Exercises: Aerobic   Recumbent Bike rocking/partial revolutions x 6 min      Knee/Hip Exercises: Standing   Knee Flexion Right;Left;10 reps    Knee Flexion Limitations UE support on TM rail    Hip Flexion  Right;Left;10 reps;AROM;Stengthening;Knee bent    Hip Flexion Limitations UE support on TM rail    Hip Abduction Right;Left;10 reps;AROM;Stengthening;Knee straight    Abduction Limitations UE support on TM rail    Hip Extension Right;Left;10 reps;AROM;Stengthening;Knee straight    Extension Limitations UE support on TM rail    Functional Squat 15 reps;3 seconds    Functional Squat Limitations more even weight shift observed    Other Standing Knee Exercises Alt toe clears to 8" step x 20; single UE support on TM rail      Knee/Hip Exercises: Seated   Heel Slides Left;20 reps;AROM    Heel Slides Limitations foot resting on small ball    Other Seated Knee/Hip Exercises L Fitter leg press (1 black/1 blue) x 20      Vasopneumatic   Number Minutes Vasopneumatic  10 minutes    Vasopnuematic Location  Knee   Lt   Vasopneumatic  Pressure Medium    Vasopneumatic Temperature  34                    PT Short Term Goals - 02/01/20 1027      PT SHORT TERM GOAL #1   Title Patient will be independent with initial HEP    Status On-going    Target Date 02/21/20      PT SHORT TERM GOAL #2   Title Patient will demonstrate L knee AROM >/= 5-90 dg to allow for improved gait pattern    Status On-going    Target Date 02/21/20             PT Long Term Goals - 02/01/20 1027      PT LONG TERM GOAL #1   Title Patient will be independent with ongoing/advanced HEP for self-management at home    Status On-going      PT LONG TERM GOAL #2   Title Patient will demonstrate L knee AROM >/= 2-115 dg to allow for normal gait and stair mechanics    Status On-going      PT LONG TERM GOAL #3   Title Patient will demonstrate improved L LE strength to >/= 4+/5 for improved stability and ease of mobility    Status On-going      PT LONG TERM GOAL #4   Title Patient will ambulate with normal gait pattern w/o AD    Status On-going      PT LONG TERM GOAL #5   Title Patient will negotiate stairs reciprocally with normal step pattern w/o limitation due to L knee pain or weakness    Status On-going                 Plan - 02/07/20 0853    Clinical Impression Statement Dempsey arrives to PT with more pronounced limp today, reporting increased pain after "jarring" his L knee when he sat down to a seat lower than anticipated. Reviewed gait training to normalize gait pattern emphasizing proper placement of SPC even with L foot along with increased hip & knee flexion, increased stride length and heel strike on weight acceptance - gait pattern improving with practice but still demonstrating shorter step length with R foot at times creating uneven stride. Remainder of session focusing on weight shifting activities along with hip and quad strengthening to improve L knee stability. Session concluded with vasopnuematic compression  to reduce post-exercise pain and edema.    Comorbidities Multi-joint OA, L THA 2016, L shoulder scope, lumbar DDD    Rehab Potential  Good    PT Treatment/Interventions ADLs/Self Care Home Management;Cryotherapy;Electrical Stimulation;Iontophoresis 4mg /ml Dexamethasone;Moist Heat;DME Instruction;Gait training;Stair training;Functional mobility training;Therapeutic activities;Therapeutic exercise;Balance training;Neuromuscular re-education;Patient/family education;Manual techniques;Passive range of motion;Dry needling;Taping;Vasopneumatic Device;Joint Manipulations    PT Next Visit Plan L knee ROM; LE strengthening; gait training to normalize gait pattern    PT Home Exercise Plan HH PT HEP: standing heel raise, squat, march, seated hip abduction, LAQ, supine SAQ, heel slide, quad set; MedBridge Access Code: [email or web address]    Consulted and Agree with Plan of Care Patient           Patient will benefit from skilled therapeutic intervention in order to improve the following deficits and impairments:  Abnormal gait,Decreased activity tolerance,Decreased balance,Decreased endurance,Decreased knowledge of precautions,Decreased knowledge of use of DME,Decreased mobility,Decreased range of motion,Decreased safety awareness,Decreased scar mobility,Decreased strength,Difficulty walking,Increased edema,Increased fascial restricitons,Increased muscle spasms,Impaired perceived functional ability,Impaired flexibility,Improper body mechanics,Postural dysfunction,Pain  Visit Diagnosis: Stiffness of left knee, not elsewhere classified  Acute pain of left knee  Other abnormalities of gait and mobility  Difficulty in walking, not elsewhere classified  Other symptoms and signs involving the musculoskeletal system     Problem List Patient Active Problem List   Diagnosis Date Noted  . Status post total left knee replacement 01/03/2020  . Unilateral primary osteoarthritis, left knee 03/16/2019  . Osteoarthritis of  left hip 07/07/2014  . Status post total replacement of left hip 07/07/2014    09/07/2014, PT, MPT 02/07/2020, 2:36 PM  [facility] [address]  [address] [place], [place], [place] Phone: [phone]   Fax:  [phone]  Name: Philip [name] MRN: Alphonse Guild Date of Birth: 09-05-1960

## 2020-02-08 ENCOUNTER — Ambulatory Visit: Payer: BC Managed Care – PPO | Admitting: Physical Therapy

## 2020-02-08 ENCOUNTER — Encounter: Payer: Self-pay | Admitting: Physical Therapy

## 2020-02-08 DIAGNOSIS — R29898 Other symptoms and signs involving the musculoskeletal system: Secondary | ICD-10-CM

## 2020-02-08 DIAGNOSIS — M25662 Stiffness of left knee, not elsewhere classified: Secondary | ICD-10-CM | POA: Diagnosis not present

## 2020-02-08 DIAGNOSIS — R600 Localized edema: Secondary | ICD-10-CM

## 2020-02-08 DIAGNOSIS — M25562 Pain in left knee: Secondary | ICD-10-CM

## 2020-02-08 DIAGNOSIS — R262 Difficulty in walking, not elsewhere classified: Secondary | ICD-10-CM

## 2020-02-08 DIAGNOSIS — R2689 Other abnormalities of gait and mobility: Secondary | ICD-10-CM

## 2020-02-08 NOTE — Therapy (Signed)
[facility] [address]  [address] [place], [place], [address] Phone: [phone]   Fax:  [phone]  Physical Therapy Treatment  Patient Details  Name: Philip [name] MRN: [id number] Date of Birth: 1960-08-09 Referring Provider (PT): Kathryne Hitch, MD   Encounter Date: 02/08/2020   PT End of Session - 02/08/20 1138    Visit Number 5    Number of Visits 16    Date for PT Re-Evaluation 03/13/20    Authorization Type BCBS - VL=60 (PT/OT/ST combined)    PT Start Time 1051    PT Stop Time 1150    PT Time Calculation (min) 59 min    Activity Tolerance Patient tolerated treatment well    Behavior During Therapy [facility] for tasks assessed/performed           Past Medical History:  Diagnosis Date  . Arthritis   . Bulging lumbar disc   . Chest pain on exertion    past 2-3 weeks ---- 12/28/19-patient stated he pulled muscle in chest a long time ago but no recent "chest pain"  . Chronic diarrhea    several years    Past Surgical History:  Procedure Laterality Date  . fractured arm     as child  . SHOULDER ARTHROSCOPY    . TOTAL HIP ARTHROPLASTY Left 07/07/2014   Procedure: LEFT TOTAL HIP ARTHROPLASTY ANTERIOR APPROACH;  Surgeon: Kathryne Hitch, MD;  Location: [facility];  Service: Orthopedics;  Laterality: Left;  . TOTAL KNEE ARTHROPLASTY Left 01/03/2020   Procedure: LEFT TOTAL KNEE ARTHROPLASTY;  Surgeon: Kathryne Hitch, MD;  Location: [facility];  Service: Orthopedics;  Laterality: Left;  needs RNFA    There were no vitals filed for this visit.   Subjective Assessment - 02/08/20 1057    Subjective Patient reports that he woke up hurting this AM, reports stiff and sore    Currently in Pain? Yes    Pain Score 5     Pain Location Knee    Pain Orientation Left    Pain Descriptors / Indicators Sore;Aching    Aggravating Factors  walking    Pain Relieving Factors ibuprofen helps              [facility] PT  Assessment - 02/08/20 0001      AROM   Left Knee Extension 20    Left Knee Flexion 93                         [facility] Adult PT Treatment/Exercise - 02/08/20 0001      Knee/Hip Exercises: Stretches   Other Knee/Hip Stretches seatd heel slide and bottom forward stretch 3x20 seconds      Knee/Hip Exercises: Aerobic   Recumbent Bike rocking/partial revolutions x 6 min    Nustep level 4 x 6 minutes      Knee/Hip Exercises: Machines for Strengthening   Cybex Knee Extension 5# 2x8    Cybex Knee Flexion 15# 2x10      Knee/Hip Exercises: Standing   Terminal Knee Extension Left;15 reps    Theraband Level (Terminal Knee Extension) Level 3 (Green)    Walking with Sports Cord fwd and backward      Knee/Hip Exercises: Seated   Other Seated Knee/Hip Exercises sit to stand on bench with airex under him to raise the surface, no hands 2x5      Vasopneumatic   Number Minutes Vasopneumatic  10 minutes  Vasopnuematic Location  Knee    Vasopneumatic Pressure Medium    Vasopneumatic Temperature  34      Manual Therapy   Manual Therapy Passive ROM    Joint Mobilization some light joint distraction with the passive motions    Passive ROM for knee flexion and extnesion                    PT Short Term Goals - 02/01/20 1027      PT SHORT TERM GOAL #1   Title Patient will be independent with initial HEP    Status On-going    Target Date 02/21/20      PT SHORT TERM GOAL #2   Title Patient will demonstrate L knee AROM >/= 5-90 dg to allow for improved gait pattern    Status On-going    Target Date 02/21/20             PT Long Term Goals - 02/08/20 1142      PT LONG TERM GOAL #1   Title Patient will be independent with ongoing/advanced HEP for self-management at home    Status On-going      PT LONG TERM GOAL #2   Title Patient will demonstrate L knee AROM >/= 2-115 dg to allow for normal gait and stair mechanics    Status On-going                  Plan - 02/08/20 1139    Clinical Impression Statement Patient doing well, just very tight especially the HS, the AROM is much improved with flexion, he lost ROM into extension, but I did measure him with a LAQ against gravity.  I added a lot of new things to try to progress him today, he did need cues for mm activation and to go slow and easy, tends to push hard    PT Next Visit Plan L knee ROM; LE strengthening; gait training to normalize gait pattern    Consulted and Agree with Plan of Care Patient           Patient will benefit from skilled therapeutic intervention in order to improve the following deficits and impairments:  Abnormal gait,Decreased activity tolerance,Decreased balance,Decreased endurance,Decreased knowledge of precautions,Decreased knowledge of use of DME,Decreased mobility,Decreased range of motion,Decreased safety awareness,Decreased scar mobility,Decreased strength,Difficulty walking,Increased edema,Increased fascial restricitons,Increased muscle spasms,Impaired perceived functional ability,Impaired flexibility,Improper body mechanics,Postural dysfunction,Pain  Visit Diagnosis: Stiffness of left knee, not elsewhere classified  Acute pain of left knee  Other abnormalities of gait and mobility  Difficulty in walking, not elsewhere classified  Other symptoms and signs involving the musculoskeletal system  Localized edema     Problem List Patient Active Problem List   Diagnosis Date Noted  . Status post total left knee replacement 01/03/2020  . Unilateral primary osteoarthritis, left knee 03/16/2019  . Osteoarthritis of left hip 07/07/2014  . Status post total replacement of left hip 07/07/2014    Jearld Lesch., PT 02/08/2020, 11:43 AM  [facility] [address]  [address] [place], [place], [address] Phone: [phone]   Fax:  [phone]  Name: JERMEY CLOSS MRN:  [id number] Date of Birth: 02-Nov-1960

## 2020-02-10 ENCOUNTER — Ambulatory Visit: Payer: BC Managed Care – PPO | Admitting: Physical Therapy

## 2020-02-10 ENCOUNTER — Encounter: Payer: Self-pay | Admitting: Physical Therapy

## 2020-02-10 ENCOUNTER — Other Ambulatory Visit: Payer: Self-pay

## 2020-02-10 DIAGNOSIS — M25562 Pain in left knee: Secondary | ICD-10-CM

## 2020-02-10 DIAGNOSIS — R29898 Other symptoms and signs involving the musculoskeletal system: Secondary | ICD-10-CM

## 2020-02-10 DIAGNOSIS — M25662 Stiffness of left knee, not elsewhere classified: Secondary | ICD-10-CM

## 2020-02-10 DIAGNOSIS — R600 Localized edema: Secondary | ICD-10-CM

## 2020-02-10 DIAGNOSIS — R262 Difficulty in walking, not elsewhere classified: Secondary | ICD-10-CM

## 2020-02-10 DIAGNOSIS — R2689 Other abnormalities of gait and mobility: Secondary | ICD-10-CM

## 2020-02-10 NOTE — Therapy (Signed)
[facility] [address]  [place], [place], [address] Phone: [phone]   Fax:  [phone]  Physical Therapy Treatment / Progress Note  Patient Details  Name: Philip [name] MRN: [id number] Date of Birth: April 10, 1960 Referring Provider (PT): Philip Rossetti, MD   Progress Note  Reporting Period 01/31/20 to 02/10/20  See note below for Objective Data and Assessment of Progress/Goals.      Encounter Date: 02/10/2020   PT End of Session - 02/10/20 1012    Visit Number 6    Number of Visits 16    Date for PT Re-Evaluation 03/13/20    Authorization Type BCBS - VL=60 (PT/OT/ST combined)    PT Start Time 1012    PT Stop Time 1114    PT Time Calculation (min) 62 min    Activity Tolerance Patient tolerated treatment [name]    Behavior During Therapy WFL for tasks assessed/performed           Past Medical History:  Diagnosis Date  . Arthritis   . Bulging lumbar disc   . Chest pain on exertion    past 2-3 weeks ---- 12/28/19-patient stated he pulled muscle in chest a long time ago but no recent "chest pain"  . Chronic diarrhea    several years    Past Surgical History:  Procedure Laterality Date  . fractured arm     as child  . SHOULDER ARTHROSCOPY    . TOTAL HIP ARTHROPLASTY Left 07/07/2014   Procedure: LEFT TOTAL HIP ARTHROPLASTY ANTERIOR APPROACH;  Surgeon: Philip Rossetti, MD;  Location: [facility];  Service: Orthopedics;  Laterality: Left;  . TOTAL KNEE ARTHROPLASTY Left 01/03/2020   Procedure: LEFT TOTAL KNEE ARTHROPLASTY;  Surgeon: Philip Rossetti, MD;  Location: [facility];  Service: Orthopedics;  Laterality: Left;  needs RNFA    There were no vitals filed for this visit.   Subjective Assessment - 02/10/20 1016    Subjective Pt reports he feels like the exercises at home are getting easier and his knee is bending better.    Pertinent History L TKA 01/03/20, L THA, L shoulder scope     Patient Stated Goals "no pain - walk comfortable w/o limp & be normal"    Currently in Pain? Yes    Pain Score 5     Pain Location Knee    Pain Orientation Left              [facility] PT Assessment - 02/10/20 1012      Assessment   Medical Diagnosis L TKA    Referring Provider (PT) Philip Rossetti, MD    Onset Date/Surgical Date 01/03/20    Next MD Visit 02/14/20      AROM   Left Knee Extension 6   20 in LAQ   Left Knee Flexion 97      PROM   Left Knee Extension 5                         [facility] Adult PT Treatment/Exercise - 02/10/20 1012      Ambulation/Gait   Ambulation/Gait Assistance 5: Supervision    Ambulation/Gait Assistance Details cues for decreased R trunk lean with reduced weightbearing on SPC, relying more on cane for balance - resulting in better upright posture and decreased appearance of limp with gait    Ambulation Distance (Feet) 180 Feet    Assistive device Straight cane  Gait Pattern Step-through pattern;Antalgic;Decreased weight shift to left;Decreased stance time - left;Decreased stride length;Left flexed knee in stance;Decreased hip/knee flexion - left;Lateral trunk lean to right    Ambulation Surface Level;Indoor    Stairs Yes    Stairs Assistance 5: Supervision    Stairs Assistance Details (indicate cue type and reason) cues for proper sequencing and quad control with alternating reciprocal stair negotiation    Stair Management Technique One rail Left;Forwards;With cane    Number of Stairs 14   x 2 sets   Height of Stairs 7    Gait Comments Increased effort noted with L LE hip and knee extension on stair ascent leading with L foot. Decreased quad control and limited knee flexion ROM making reciprocal descent more difficult.      Exercises   Exercises Knee/Hip      Knee/Hip Exercises: Aerobic   Recumbent Bike rocking/partial revolutions x 6 min      Knee/Hip Exercises: Machines for Strengthening   Cybex Knee Extension B  con/ecc 10# x 10, B con/L ecc 5# x 10    Cybex Knee Flexion B con/L ecc 15# 2 x 10      Knee/Hip Exercises: Standing   Terminal Knee Extension Left;15 reps;Strengthening;Theraband    Theraband Level (Terminal Knee Extension) Level 4 (Blue)    Functional Squat 15 reps;3 seconds    Functional Squat Limitations TRX      Knee/Hip Exercises: Seated   Heel Slides Left;20 reps;AROM    Heel Slides Limitations foot resting on small ball      Vasopneumatic   Number Minutes Vasopneumatic  10 minutes    Vasopnuematic Location  Knee   Lt   Vasopneumatic Pressure Medium    Vasopneumatic Temperature  34      Manual Therapy   Joint Mobilization L patellar mobs all direction - emphasis on sup/inf to facilitate flexion/extension ROM; grade II-III PA femorotibial mobs with gentle distraciton to faciliate increased extension ROM    Passive ROM L knee flexion & extension PROM with light overpressure for stretch at end ROM                    PT Short Term Goals - 02/10/20 1018      PT SHORT TERM GOAL #1   Title Patient will be independent with initial HEP    Status Achieved   02/10/20     PT SHORT TERM GOAL #2   Title Patient will demonstrate L knee AROM >/= 5-90 dg to allow for improved gait pattern    Status Partially Met   02/10/20 - met for flexion AROM and extension PROM   Target Date 02/21/20             PT Long Term Goals - 02/10/20 1019      PT LONG TERM GOAL #1   Title Patient will be independent with ongoing/advanced HEP for self-management at home    Status On-going    Target Date 03/13/20      PT LONG TERM GOAL #2   Title Patient will demonstrate L knee AROM >/= 2-115 dg to allow for normal gait and stair mechanics    Status On-going    Target Date 03/13/20      PT LONG TERM GOAL #3   Title Patient will demonstrate improved L LE strength to >/= 4+/5 for improved stability and ease of mobility    Status On-going    Target Date 03/13/20      PT  LONG TERM GOAL #4    Title Patient will ambulate with normal gait pattern w/o AD    Status On-going    Target Date 03/13/20      PT LONG TERM GOAL #5   Title Patient will negotiate stairs reciprocally with normal step pattern w/o limitation due to L knee pain or weakness    Status On-going    Target Date 03/13/20                 Plan - 02/10/20 1021    Clinical Impression Statement Philip [name] reports continued swelling and tightness/stiffness typically worst in the mornings and loosening up as he gets moving - reassured pt that this is normal for this stage post-op. Pt noted to be leaning heavily on cane causing increased trunk lean to R and increased appearance of limp on L - cues to reduce weight on cane, relying more on cane for balance, resulting in more normal gait pattern. First attempt at reciprocal stair negotiation revealing increased effort with L LE lift on ascent and decreased control on descent due to limited flexion ROM and decrease eccentric quad control. L knee AROM continues to improve - currently 6-97 with extension AROM more limited in LAQ at 20 due to HS tightness, L knee PROM into extension at 5 (STG #2 partially met). Philip [name] with PT and will continue to benefit from skilled PT to increase functional L knee ROM and strength and restore normal gait pattern and mobility to allow return to normal daily activities.    Comorbidities Multi-joint OA, L THA 2016, L shoulder scope, lumbar DDD    Rehab Potential Good    PT Frequency 3x / week   3x/wk x 3-4 wks, tapering to 2x/wk for duration of POC   PT Duration 6 weeks    PT Treatment/Interventions ADLs/Self Care Home Management;Cryotherapy;Electrical Stimulation;Iontophoresis 36m/ml Dexamethasone;Moist Heat;DME Instruction;Gait training;Stair training;Functional mobility training;Therapeutic activities;Therapeutic exercise;Balance training;Neuromuscular re-education;Patient/family education;Manual techniques;Passive range of  motion;Dry needling;Taping;Vasopneumatic Device;Joint Manipulations    PT Next Visit Plan L knee ROM; LE strengthening; gait training to normalize gait pattern    PT Home Exercise Plan --    Consulted and Agree with Plan of Care Patient           Patient will benefit from skilled therapeutic intervention in order to improve the following deficits and impairments:  Abnormal gait,Decreased activity tolerance,Decreased balance,Decreased endurance,Decreased knowledge of precautions,Decreased knowledge of use of DME,Decreased mobility,Decreased range of motion,Decreased safety awareness,Decreased scar mobility,Decreased strength,Difficulty walking,Increased edema,Increased fascial restricitons,Increased muscle spasms,Impaired perceived functional ability,Impaired flexibility,Improper body mechanics,Postural dysfunction,Pain  Visit Diagnosis: Stiffness of left knee, not elsewhere classified  Acute pain of left knee  Other abnormalities of gait and mobility  Difficulty in walking, not elsewhere classified  Other symptoms and signs involving the musculoskeletal system  Localized edema     Problem List Patient Active Problem List   Diagnosis Date Noted  . Status post total left knee replacement 01/03/2020  . Unilateral primary osteoarthritis, left knee 03/16/2019  . Osteoarthritis of left hip 07/07/2014  . Status post total replacement of left hip 07/07/2014    JPercival Spanish PT, MPT 02/10/2020, 1:57 PM  [facility] [place] [address]Phone: [phone]  Fax:  [phone] Name: HHENDRIXX SEVERINMRN: [id number] of Birth: [birth year]

## 2020-02-10 NOTE — Patient Instructions (Addendum)
   Access Code: [email or web address] URL: [email or web address] Date: 02/10/2020 Prepared by: Glenetta Hew  Exercises Standing Terminal Knee Extension with Resistance - 1 x daily - 7 x weekly - 2 sets - 10 reps - 3-5 sec hold

## 2020-02-14 ENCOUNTER — Ambulatory Visit: Payer: BC Managed Care – PPO | Admitting: Physical Therapy

## 2020-02-14 ENCOUNTER — Ambulatory Visit (INDEPENDENT_AMBULATORY_CARE_PROVIDER_SITE_OTHER): Payer: BC Managed Care – PPO | Admitting: Orthopaedic Surgery

## 2020-02-14 ENCOUNTER — Other Ambulatory Visit: Payer: Self-pay

## 2020-02-14 ENCOUNTER — Encounter: Payer: Self-pay | Admitting: Physical Therapy

## 2020-02-14 ENCOUNTER — Encounter: Payer: Self-pay | Admitting: Orthopaedic Surgery

## 2020-02-14 DIAGNOSIS — R262 Difficulty in walking, not elsewhere classified: Secondary | ICD-10-CM

## 2020-02-14 DIAGNOSIS — R600 Localized edema: Secondary | ICD-10-CM

## 2020-02-14 DIAGNOSIS — M25662 Stiffness of left knee, not elsewhere classified: Secondary | ICD-10-CM | POA: Diagnosis not present

## 2020-02-14 DIAGNOSIS — R2689 Other abnormalities of gait and mobility: Secondary | ICD-10-CM

## 2020-02-14 DIAGNOSIS — M25562 Pain in left knee: Secondary | ICD-10-CM

## 2020-02-14 DIAGNOSIS — Z96652 Presence of left artificial knee joint: Secondary | ICD-10-CM

## 2020-02-14 DIAGNOSIS — R29898 Other symptoms and signs involving the musculoskeletal system: Secondary | ICD-10-CM

## 2020-02-14 NOTE — Patient Instructions (Signed)
      Access Code: [email or web address] URL: [email or web address] Date: 02/14/2020 Prepared by: Glenetta Hew  Exercises Standing Hip Flexion with Anchored Resistance and Chair Support - 1 x daily - 7 x weekly - 1 sets - 10 reps - 3 sec hold Standing Hip Adduction with Anchored Resistance - 1 x daily - 7 x weekly - 1 sets - 10 reps - 3 sec hold Standing Hip Extension with Anchored Resistance - 1 x daily - 7 x weekly - 1 sets - 10 reps - 3 sec hold Standing Hip Abduction with Anchored Resistance - 1 x daily - 7 x weekly - 1 sets - 10 reps - 3 sec hold Step Up - 1 x daily - 7 x weekly - 2 sets - 10 reps

## 2020-02-14 NOTE — Progress Notes (Signed)
HPI: Philip [name] returns today 6 weeks status post left total knee arthroplasty.  He states he is overall improving.  He still doing physical therapy.  He is taking mainly ibuprofen for pain and using Robaxin for muscle spasm.  Physical exam: General well-developed well-nourished male no acute distress surgical incisions well-healed.  Left calf supple nontender.  He has full extension flexion to 100 degrees actively.  Passively and bring him to approximately 105 degrees.  He is ambulating with a cane.  Impression: Status post left total knee arthroplasty 01/03/2020  Plan: He will continue work on range of motion strengthening.  He will remain out of work until follow-up in 4 weeks.  Will reevaluate his work status at that time.  Questions were encouraged and answered by Dr. Magnus [name] and myself.

## 2020-02-14 NOTE — Therapy (Signed)
[facility] [address]  [place], [place], [address] Phone: [phone]   Fax:  [phone]  Physical Therapy Treatment  Patient Details  Name: Philip [name] MRN: [id number] Date of Birth: [birth year] Referring Provider (PT): Mcarthur Rossetti, MD   Encounter Date: 02/14/2020   PT End of Session - 02/14/20 1314    Visit Number 7    Number of Visits 16    Date for PT Re-Evaluation 03/13/20    Authorization Type BCBS - VL=60 (PT/OT/ST combined)    Progress Note Due on Visit 31   MD PN on visit #6 - 02/10/20   PT Start Time 1314    PT Stop Time 1410    PT Time Calculation (min) 56 min    Activity Tolerance Patient tolerated treatment well    Behavior During Therapy [facility] for tasks assessed/performed           Past Medical History:  Diagnosis Date  . Arthritis   . Bulging lumbar disc   . Chest pain on exertion    past 2-3 weeks ---- 12/28/19-patient stated he pulled muscle in chest a long time ago but no recent "chest pain"  . Chronic diarrhea    several years    Past Surgical History:  Procedure Laterality Date  . fractured arm     as child  . SHOULDER ARTHROSCOPY    . TOTAL HIP ARTHROPLASTY Left 07/07/2014   Procedure: LEFT TOTAL HIP ARTHROPLASTY ANTERIOR APPROACH;  Surgeon: Mcarthur Rossetti, MD;  Location: [facility];  Service: Orthopedics;  Laterality: Left;  . TOTAL KNEE ARTHROPLASTY Left 01/03/2020   Procedure: LEFT TOTAL KNEE ARTHROPLASTY;  Surgeon: Mcarthur Rossetti, MD;  Location: [facility];  Service: Orthopedics;  Laterality: Left;  needs RNFA    There were no vitals filed for this visit.   Subjective Assessment - 02/14/20 1317    Subjective Pt reports MD pleased with his progress and will f/u with him again in 1 month. Still notes stiffness and tightness with some pain when first getting moving after period of inactivity.    Pertinent History L TKA 01/03/20, L THA, L shoulder scope     Patient Stated Goals "no pain - walk comfortable w/o limp & be normal"    Currently in Pain? Yes    Pain Score 4     Pain Location Knee    Pain Orientation Left    Pain Descriptors / Indicators Tightness    Pain Type Surgical pain;Acute pain    Pain Frequency Intermittent              [facility] PT Assessment - 02/14/20 1314      Assessment   Medical Diagnosis L TKA    Referring Provider (PT) Mcarthur Rossetti, MD    Onset Date/Surgical Date 01/03/20    Next MD Visit 03/13/20                         [facility] Adult PT Treatment/Exercise - 02/14/20 1314      Ambulation/Gait   Ambulation/Gait Assistance 5: Supervision    Assistive device None    Gait Pattern Step-through pattern;Antalgic;Decreased weight shift to left;Decreased stance time - left;Decreased stride length;Left flexed knee in stance;Decreased hip/knee flexion - left;Trunk flexed    Gait Comments Much more pronounced limp present with gait attempt w/o AD, therefore pt instructed to continue use of SPC for now.  Exercises   Exercises Knee/Hip      Knee/Hip Exercises: Stretches   Ambulance person reps;30 seconds    Quad Stretch Limitations prone with strap & prone RF with strap      Knee/Hip Exercises: Aerobic   Recumbent Bike rocking/partial revolutions x 6 min      Knee/Hip Exercises: Standing   Hip Flexion Left;Right;10 reps;Stengthening;Knee straight    Hip Flexion Limitations red TB at ankle - cues to activate quad to maintain knee fully extended, SPC for balance    Hip ADduction Left;Right;10 reps;Strengthening    Hip ADduction Limitations red TB at ankle in wide BOS pulling medially, SPC for balance    Hip Abduction Left;Right;10 reps;Stengthening;Knee straight    Abduction Limitations red TB at ankle - cues to activate quad to maintain knee fully extended, SPC for balance    Hip Extension Left;Right;10 reps;Stengthening;Knee straight    Extension Limitations red TB at ankle - cues to  activate quad to maintain knee fully extended using glutes to extend hip rather than HS curl, SPC for balance    Forward Step Up Left;15 reps;Step Height: 8";Hand Hold: 2    Forward Step Up Limitations cues to increase L hip & knee flexion to avoid circumducting at hip      Knee/Hip Exercises: Prone   Prone Knee Hang 3 minutes;2 minutes    Prone Knee Hang Weights (lbs) initially with PT performing STM & IASTM with roller stick to HS      Vasopneumatic   Number Minutes Vasopneumatic  10 minutes    Vasopnuematic Location  Knee   Lt   Vasopneumatic Pressure Medium    Vasopneumatic Temperature  34      Manual Therapy   Manual Therapy Soft tissue mobilization;Myofascial release    Soft tissue mobilization STM & roller stick to L HS while in prone hang stretch position    Myofascial Release pin & stretch to L HS while in prone hang stretch position                  PT Education - 02/14/20 1400    Education Details HEP update - Access Code: [email or web address]    Person(s) Educated Patient    Methods Explanation;Demonstration;Verbal cues;Handout    Comprehension Verbalized understanding;Verbal cues required;Returned demonstration;Need further instruction            PT Short Term Goals - 02/10/20 1018      PT SHORT TERM GOAL #1   Title Patient will be independent with initial HEP    Status Achieved   02/10/20     PT SHORT TERM GOAL #2   Title Patient will demonstrate L knee AROM >/= 5-90 dg to allow for improved gait pattern    Status Partially Met   02/10/20 - met for flexion AROM and extension PROM   Target Date 02/21/20             PT Long Term Goals - 02/10/20 1019      PT LONG TERM GOAL #1   Title Patient will be independent with ongoing/advanced HEP for self-management at home    Status On-going    Target Date 03/13/20      PT LONG TERM GOAL #2   Title Patient will demonstrate L knee AROM >/= 2-115 dg to allow for normal gait and stair mechanics    Status On-going     Target Date 03/13/20      PT LONG TERM GOAL #3   Title Patient  will demonstrate improved L LE strength to >/= 4+/5 for improved stability and ease of mobility    Status On-going    Target Date 03/13/20      PT LONG TERM GOAL #4   Title Patient will ambulate with normal gait pattern w/o AD    Status On-going    Target Date 03/13/20      PT LONG TERM GOAL #5   Title Patient will negotiate stairs reciprocally with normal step pattern w/o limitation due to L knee pain or weakness    Status On-going    Target Date 03/13/20                 Plan - 02/14/20 1320    Clinical Impression Statement [name] reports MD pleased with his progress and wishes him to continue with PT. Reviewed prone quad stretch with strap, adding pillow under knee to increase RF stretch as well as introduced prone hangs to promote knee extension ROM. Progressed hip strengthening with addition of red TB in 4-way standing SLR with HEP updated accordingly. Cues necessary to avoid hip circumduction with fwd step-ups but improving control noted with increased reps, therefore also included in HEP update. Pt inquiring about weaning from [facility] but pronounced limp noted with absence of AD, therefore encouraged continued use of SPC for now.    Comorbidities Multi-joint OA, L THA 2016, L shoulder scope, lumbar DDD    Rehab Potential Good    PT Frequency 3x / week   3x/wk x 3-4 wks, tapering to 2x/wk for duration of POC   PT Duration 6 weeks    PT Treatment/Interventions ADLs/Self Care Home Management;Cryotherapy;Electrical Stimulation;Iontophoresis 29m/ml Dexamethasone;Moist Heat;DME Instruction;Gait training;Stair training;Functional mobility training;Therapeutic activities;Therapeutic exercise;Balance training;Neuromuscular re-education;Patient/family education;Manual techniques;Passive range of motion;Dry needling;Taping;Vasopneumatic Device;Joint Manipulations    PT Next Visit Plan L knee ROM; LE strengthening; gait training to  normalize gait pattern    PT Home Exercise Plan [facility] PT HEP: standing heel raise, squat, march, seated hip abduction, LAQ, supine SAQ, heel slide, quad set; MedBridge Access Codes: [email or web address](02/17/2022; [email or web address] (2/8)    Consulted and Agree with Plan of Care Patient           Patient will benefit from skilled therapeutic intervention in order to improve the following deficits and impairments:  Abnormal gait,Decreased activity tolerance,Decreased balance,Decreased endurance,Decreased knowledge of precautions,Decreased knowledge of use of DME,Decreased mobility,Decreased range of motion,Decreased safety awareness,Decreased scar mobility,Decreased strength,Difficulty walking,Increased edema,Increased fascial restricitons,Increased muscle spasms,Impaired perceived functional ability,Impaired flexibility,Improper body mechanics,Postural dysfunction,Pain  Visit Diagnosis: Stiffness of left knee, not elsewhere classified  Acute pain of left knee  Other abnormalities of gait and mobility  Difficulty in walking, not elsewhere classified  Other symptoms and signs involving the musculoskeletal system  Localized edema     Problem List Patient Active Problem List   Diagnosis Date Noted  . Status post total left knee replacement 01/03/2020  . Unilateral primary osteoarthritis, left knee 03/16/2019  . Osteoarthritis of left hip 07/07/2014  . Status post total replacement of left hip 07/07/2014    JPercival Spanish PT, MPT 02/14/2020, 7:57 PM  [facility] [place] [address]Phone: [phone]  Fax:  [phone] Name: HEMEKA LINDNERMRN: [id number] of Birth: 509-Oct-1962

## 2020-02-16 ENCOUNTER — Other Ambulatory Visit: Payer: Self-pay

## 2020-02-16 ENCOUNTER — Ambulatory Visit: Payer: BC Managed Care – PPO

## 2020-02-16 DIAGNOSIS — R262 Difficulty in walking, not elsewhere classified: Secondary | ICD-10-CM

## 2020-02-16 DIAGNOSIS — M25562 Pain in left knee: Secondary | ICD-10-CM

## 2020-02-16 DIAGNOSIS — R600 Localized edema: Secondary | ICD-10-CM

## 2020-02-16 DIAGNOSIS — M25662 Stiffness of left knee, not elsewhere classified: Secondary | ICD-10-CM

## 2020-02-16 DIAGNOSIS — R2689 Other abnormalities of gait and mobility: Secondary | ICD-10-CM

## 2020-02-16 DIAGNOSIS — R29898 Other symptoms and signs involving the musculoskeletal system: Secondary | ICD-10-CM

## 2020-02-16 NOTE — Therapy (Addendum)
[facility] [address]  [address] [place], [place], [address] Phone: [phone]   Fax:  [phone]  Physical Therapy Treatment  Patient Details  Name: Philip [name] MRN: [id number] Date of Birth: 1960-05-02 Referring Provider (PT): Kathryne Hitch, MD   Encounter Date: 02/16/2020   PT End of Session - 02/16/20 1105    Visit Number 8    Number of Visits 16    Date for PT Re-Evaluation 03/13/20    Authorization Type BCBS - VL=60 (PT/OT/ST combined)    Progress Note Due on Visit 16    PT Start Time 1019    PT Stop Time 1112    PT Time Calculation (min) 53 min    Activity Tolerance Patient tolerated treatment well    Behavior During Therapy [facility] for tasks assessed/performed           Past Medical History:  Diagnosis Date  . Arthritis   . Bulging lumbar disc   . Chest pain on exertion    past 2-3 weeks ---- 12/28/19-patient stated he pulled muscle in chest a long time ago but no recent "chest pain"  . Chronic diarrhea    several years    Past Surgical History:  Procedure Laterality Date  . fractured arm     as child  . SHOULDER ARTHROSCOPY    . TOTAL HIP ARTHROPLASTY Left 07/07/2014   Procedure: LEFT TOTAL HIP ARTHROPLASTY ANTERIOR APPROACH;  Surgeon: Kathryne Hitch, MD;  Location: [facility];  Service: Orthopedics;  Laterality: Left;  . TOTAL KNEE ARTHROPLASTY Left 01/03/2020   Procedure: LEFT TOTAL KNEE ARTHROPLASTY;  Surgeon: Kathryne Hitch, MD;  Location: [facility];  Service: Orthopedics;  Laterality: Left;  needs RNFA    There were no vitals filed for this visit.   Subjective Assessment - 02/16/20 1037    Subjective Pt states he is doing well, noted swelling and stiffness wih some pain in the anterior medial knee.    Pertinent History L TKA 01/03/20, L THA, L shoulder scope    Patient Stated Goals "no pain - walk comfortable w/o limp & be normal"    Currently in Pain? Yes    Pain Score 3      Pain Location Knee    Pain Orientation Left;Anterior;Medial    Pain Type Acute pain                             [facility] Adult PT Treatment/Exercise - 02/16/20 0001      Exercises   Exercises Knee/Hip      Knee/Hip Exercises: Stretches   Quad Stretch Left;2 reps;30 seconds    Quad Stretch Limitations prone with strap & prone RF with strap      Knee/Hip Exercises: Aerobic   Recumbent Bike partial revolutions 6 min      Knee/Hip Exercises: Standing   Hip Flexion Left;Right;10 reps;Stengthening;Knee straight    Hip Flexion Limitations red TB at ankle - cues to activate quad to maintain knee fully extended, chair required for stability    Hip ADduction Left;Right;10 reps;Strengthening    Hip ADduction Limitations red TB at ankle in wide BOS pulling medially, chair required for stability    Hip Abduction Left;Right;10 reps;Stengthening;Knee straight    Abduction Limitations red TB at ankle - cues to activate quad to maintain knee fully extended, chair required for stability    Hip Extension Left;Right;10 reps;Stengthening;Knee straight  Extension Limitations red TB at ankle - cues to activate quad to maintain knee fully extended using glutes to extend hip rather than HS curl, chair required for stability    Forward Step Up Left;15 reps;Step Height: 6";Hand Hold: 2    Forward Step Up Limitations --   cues to prevent fwd trunk lean and isolate hip/knee muscles     Knee/Hip Exercises: Prone   Prone Knee Hang 4 minutes;Weights   4#     Vasopneumatic   Number Minutes Vasopneumatic  10 minutes    Vasopnuematic Location  Knee    Vasopneumatic Pressure Low    Vasopneumatic Temperature  36                    PT Short Term Goals - 02/16/20 1111      PT SHORT TERM GOAL #1   Title Patient will be independent with initial HEP             PT Long Term Goals - 02/10/20 1019      PT LONG TERM GOAL #1   Title Patient will be independent with  ongoing/advanced HEP for self-management at home    Status On-going    Target Date 03/13/20      PT LONG TERM GOAL #2   Title Patient will demonstrate L knee AROM >/= 2-115 dg to allow for normal gait and stair mechanics    Status On-going    Target Date 03/13/20      PT LONG TERM GOAL #3   Title Patient will demonstrate improved L LE strength to >/= 4+/5 for improved stability and ease of mobility    Status On-going    Target Date 03/13/20      PT LONG TERM GOAL #4   Title Patient will ambulate with normal gait pattern w/o AD    Status On-going    Target Date 03/13/20      PT LONG TERM GOAL #5   Title Patient will negotiate stairs reciprocally with normal step pattern w/o limitation due to L knee pain or weakness    Status On-going    Target Date 03/13/20                 Plan - 02/16/20 1106    Clinical Impression Statement Patient performed exercises well w/o any reports of pain. Cues required for postural alignment, to prevent fwd trunk lean while completing standing activties. Cues also given to facilitate symmetrical WB on both LE. Game ready post treatment to address any residual swelling and soreness.    Comorbidities Multi-joint OA, L THA 2016, L shoulder scope, lumbar DDD    Examination-Activity Limitations Bathing;Bed Mobility;Bend;Caring for Others;Dressing;Hygiene/Grooming;Locomotion Level;Sit;Sleep;Squat;Stairs;Stand;Transfers    PT Treatment/Interventions ADLs/Self Care Home Management;Cryotherapy;Electrical Stimulation;Iontophoresis 4mg /ml Dexamethasone;Moist Heat;DME Instruction;Gait training;Stair training;Functional mobility training;Therapeutic activities;Therapeutic exercise;Balance training;Neuromuscular re-education;Patient/family education;Manual techniques;Passive range of motion;Dry needling;Taping;Vasopneumatic Device;Joint Manipulations    PT Next Visit Plan L knee ROM; LE strengthening; gait training to normalize gait pattern    PT Home Exercise Plan  HH PT HEP: standing heel raise, squat, march, seated hip abduction, LAQ, supine SAQ, heel slide, quad set; MedBridge Access Codes: 02-18-2022); [email or web address] (2/8)           Patient will benefit from skilled therapeutic intervention in order to improve the following deficits and impairments:  Abnormal gait,Decreased activity tolerance,Decreased balance,Decreased endurance,Decreased knowledge of precautions,Decreased knowledge of use of DME,Decreased mobility,Decreased range of motion,Decreased safety awareness,Decreased scar mobility,Decreased strength,Difficulty walking,Increased edema,Increased fascial restricitons,Increased muscle spasms,Impaired  perceived functional ability,Impaired flexibility,Improper body mechanics,Postural dysfunction,Pain  Visit Diagnosis: Stiffness of left knee, not elsewhere classified  Acute pain of left knee  Other abnormalities of gait and mobility  Difficulty in walking, not elsewhere classified  Other symptoms and signs involving the musculoskeletal system  Localized edema     Problem List Patient Active Problem List   Diagnosis Date Noted  . Status post total left knee replacement 01/03/2020  . Unilateral primary osteoarthritis, left knee 03/16/2019  . Osteoarthritis of left hip 07/07/2014  . Status post total replacement of left hip 07/07/2014    Darleene Cleaver, PTA 02/16/2020, 1:19 PM  [facility] [address]  [address] [place], [place], [address] Phone: [phone]   Fax:  [phone]  Name: Philip [name] MRN: [id number] Date of Birth: [birth year]

## 2020-02-17 ENCOUNTER — Encounter: Payer: Self-pay | Admitting: Physical Therapy

## 2020-02-17 ENCOUNTER — Telehealth: Payer: Self-pay | Admitting: Physician Assistant

## 2020-02-17 ENCOUNTER — Ambulatory Visit: Payer: BC Managed Care – PPO | Admitting: Physical Therapy

## 2020-02-17 DIAGNOSIS — R29898 Other symptoms and signs involving the musculoskeletal system: Secondary | ICD-10-CM

## 2020-02-17 DIAGNOSIS — R2689 Other abnormalities of gait and mobility: Secondary | ICD-10-CM

## 2020-02-17 DIAGNOSIS — M25662 Stiffness of left knee, not elsewhere classified: Secondary | ICD-10-CM | POA: Diagnosis not present

## 2020-02-17 DIAGNOSIS — R262 Difficulty in walking, not elsewhere classified: Secondary | ICD-10-CM

## 2020-02-17 DIAGNOSIS — R600 Localized edema: Secondary | ICD-10-CM

## 2020-02-17 DIAGNOSIS — M25562 Pain in left knee: Secondary | ICD-10-CM

## 2020-02-17 NOTE — Patient Instructions (Signed)
   Access Code: [email or web address] URL: [email or web address] Date: 02/17/2020 Prepared by: Glenetta Hew  Exercises Hip Flexor Stretch on Step - 1 x daily - 7 x weekly - 3 reps - 30 sec hold Standing Knee Flexion Stretch on Step - 1 x daily - 7 x weekly - 10 reps - 5 sec hold Standing Gastroc Stretch - 1 x daily - 7 x weekly - 3 reps - 30 sec hold Standing Gastroc Stretch on Step - 1 x daily - 7 x weekly - 3 reps - 30 sec hold

## 2020-02-17 NOTE — Therapy (Signed)
[facility] [address]  [place], [place], [address] Phone: [phone]   Fax:  [phone]  Physical Therapy Treatment  Patient Details  Name: Philip [name] MRN: [id number] Date of Birth: 04/19/1960 Referring Provider (PT): Mcarthur Rossetti, MD   Encounter Date: 02/17/2020   PT End of Session - 02/17/20 0920    Visit Number 9    Number of Visits 16    Date for PT Re-Evaluation 03/13/20    Authorization Type BCBS - VL=60 (PT/OT/ST combined)    Progress Note Due on Visit 49   MD PN on visit #6 - 02/10/20   PT Start Time 0920    PT Stop Time 1026    PT Time Calculation (min) 66 min    Activity Tolerance Patient tolerated treatment well    Behavior During Therapy [facility] for tasks assessed/performed           Past Medical History:  Diagnosis Date  . Arthritis   . Bulging lumbar disc   . Chest pain on exertion    past 2-3 weeks ---- 12/28/19-patient stated he pulled muscle in chest a long time ago but no recent "chest pain"  . Chronic diarrhea    several years    Past Surgical History:  Procedure Laterality Date  . fractured arm     as child  . SHOULDER ARTHROSCOPY    . TOTAL HIP ARTHROPLASTY Left 07/07/2014   Procedure: LEFT TOTAL HIP ARTHROPLASTY ANTERIOR APPROACH;  Surgeon: Mcarthur Rossetti, MD;  Location: [facility];  Service: Orthopedics;  Laterality: Left;  . TOTAL KNEE ARTHROPLASTY Left 01/03/2020   Procedure: LEFT TOTAL KNEE ARTHROPLASTY;  Surgeon: Mcarthur Rossetti, MD;  Location: [facility];  Service: Orthopedics;  Laterality: Left;  needs RNFA    There were no vitals filed for this visit.   Subjective Assessment - 02/17/20 0924    Subjective Pt reports a feeling of a "rub" on the back of his kneecap - not painful but aware of sensation.    Pertinent History L TKA 01/03/20, L THA, L shoulder scope    Patient Stated Goals "no pain - walk comfortable w/o limp & be normal"    Currently in  Pain? Yes    Pain Score 3     Pain Location Knee    Pain Orientation Left;Anterior;Medial;Lateral    Pain Descriptors / Indicators Tightness    Pain Type Acute pain    Pain Frequency Intermittent              [facility] PT Assessment - 02/17/20 0920      AROM   Left Knee Extension 5   18 in LAQ   Left Knee Flexion 98                         [facility] Adult PT Treatment/Exercise - 02/17/20 0920      Ambulation/Gait   Ambulation/Gait Assistance 5: Supervision    Ambulation/Gait Assistance Details Continued cues necessary for L hip and knee flexion during swing phase with good heel strike on weight acceptance and knee extension during stance phase of gait.    Assistive device None    Gait Pattern Step-through pattern;Decreased hip/knee flexion - left;Decreased stance time - left;Left flexed knee in stance;Trunk flexed;Lateral trunk lean to left    Ambulation Surface Level;Indoor    Stairs Assistance 5: Supervision    Stairs Assistance Details (indicate cue type and reason)  Continued cues necessary for L hip and knee flexion during swing phase with good heel strike on weight acceptance and knee extension during stance phase of gait.    Stair Management Technique One rail Left;Forwards;With cane    Number of Stairs 14   x 2 sets   Height of Stairs 7    Gait Comments Decreased limp apparent during gait both with and w/o SPC but tendency for increased L fwd/lateral trunk lean present with gait w/o AD. Improving L hip and knee flexion on stair ascent resulting in decreased tendency for hip circumduction but continued use of R Tredelenburg drop noted on stair descent due to decreased quad control and limited knee flexion ROM making reciprocal descent more difficult.      Exercises   Exercises Knee/Hip      Knee/Hip Exercises: Stretches   Hip Flexor Stretch Left;2 reps;30 seconds    Hip Flexor Stretch Limitations lunge position with R foot on BATCA seat    Knee: Self-Stretch to  increase Flexion Left   10 x 5"   Knee: Self-Stretch Limitations step stretch with L foot on BATCA seat at lowest position    Gastroc Stretch Left;3 reps;30 seconds    Gastroc Stretch Limitations runner stretch, negative heel off edge of step & toes on baseboard - pt noting good stretch with first 2 versions of stretch, but more difficulty with latter version      Knee/Hip Exercises: Aerobic   Recumbent Bike partial revolutions x 6 min      Vasopneumatic   Number Minutes Vasopneumatic  10 minutes    Vasopnuematic Location  Knee   Lt   Vasopneumatic Pressure High    Vasopneumatic Temperature  34      Manual Therapy   Manual Therapy Joint mobilization;Soft tissue mobilization    Joint Mobilization L patellar mobs all direction - emphasis on sup/inf to facilitate flexion/extension ROM; grade II-III PA femorotibial mobs with gentle distraciton to faciliate increased extension ROM    Soft tissue mobilization retrograde massage from L calf to proximal knee for edema reduction; L knee incisional scar mobilization full length of incision as scabbing now fully gone                  PT Education - 02/17/20 1406    Education Details HEP update (stretchess) - Access Code: [email or web address]    Person(s) Educated Patient    Methods Explanation;Demonstration;Handout;Verbal cues    Comprehension Verbalized understanding;Verbal cues required;Returned demonstration;Need further instruction            PT Short Term Goals - 02/17/20 2831      PT SHORT TERM GOAL #1   Title Patient will be independent with initial HEP    Status Achieved   02/10/20     PT SHORT TERM GOAL #2   Title Patient will demonstrate L knee AROM >/= 5-90 dg to allow for improved gait pattern    Status Achieved   02/17/20 - L knee AROM 5-98            PT Long Term Goals - 02/17/20 0930      PT LONG TERM GOAL #1   Title Patient will be independent with ongoing/advanced HEP for self-management at home    Status Partially  Met   02/17/20 - met for current HEP   Target Date 03/13/20      PT LONG TERM GOAL #2   Title Patient will demonstrate L knee AROM >/= 2-115 dg to allow for  normal gait and stair mechanics    Status On-going    Target Date 03/13/20      PT LONG TERM GOAL #3   Title Patient will demonstrate improved L LE strength to >/= 4+/5 for improved stability and ease of mobility    Status On-going      PT LONG TERM GOAL #4   Title Patient will ambulate with normal gait pattern w/o AD    Status On-going    Target Date 03/13/20      PT LONG TERM GOAL #5   Title Patient will negotiate stairs reciprocally with normal step pattern w/o limitation due to L knee pain or weakness    Status On-going    Target Date 03/13/20                 Plan - 02/17/20 0928    Clinical Impression Statement Philip [name] continues to demonstrate gait deviations related to limited L knee ROM as well as hip flexor tightness and chronic low back issues but decreasing limp/antalgic pattern evident. L knee ROM slowly improving but remains limited due to muscle tightness in quads/hip flexors and HS/gastroc as well as increased knee and distal LE edema. Therapeutic exercises focusing on stretching for tight musculature (HEP update to include hip flexor and gastroc stretches as well as review of knee flexion step stretch) and manual therapy focusing on L patellar and knee joint mobility (patella very restricted sup/inf with PT leaving 3+ pitting edema imprint) and retrograde massage for edema reduction, with session concluded with GameReady vasopnuematic compression on high pressure to further reduce post-exercise pain and edema.    Comorbidities Multi-joint OA, L THA 2016, L shoulder scope, lumbar DDD    Examination-Activity Limitations Bathing;Bed Mobility;Bend;Caring for Others;Dressing;Hygiene/Grooming;Locomotion Level;Sit;Sleep;Squat;Stairs;Stand;Transfers    PT Frequency 3x / week   3x/wk x 3-4 wks, tapering to 2x/wk for  duration of POC   PT Duration 6 weeks    PT Treatment/Interventions ADLs/Self Care Home Management;Cryotherapy;Electrical Stimulation;Iontophoresis 35m/ml Dexamethasone;Moist Heat;DME Instruction;Gait training;Stair training;Functional mobility training;Therapeutic activities;Therapeutic exercise;Balance training;Neuromuscular re-education;Patient/family education;Manual techniques;Passive range of motion;Dry needling;Taping;Vasopneumatic Device;Joint Manipulations    PT Next Visit Plan 10th visit FOTO; L knee ROM; LE strengthening; manual therapy for knee ROM and edema reduction with possible trial of kinesiotaping for edema; gait training to normalize gait pattern    PT Home Exercise Plan HH PT HEP: standing heel raise, squat, march, seated hip abduction, LAQ, supine SAQ, heel slide, quad set; MedBridge Access Codes: [email or web address] (02-28-22; [email or web address] (2/8); [email or web address](2/11)    Consulted and Agree with Plan of Care Patient           Patient will benefit from skilled therapeutic intervention in order to improve the following deficits and impairments:  Abnormal gait,Decreased activity tolerance,Decreased balance,Decreased endurance,Decreased knowledge of precautions,Decreased knowledge of use of DME,Decreased mobility,Decreased range of motion,Decreased safety awareness,Decreased scar mobility,Decreased strength,Difficulty walking,Increased edema,Increased fascial restricitons,Increased muscle spasms,Impaired perceived functional ability,Impaired flexibility,Improper body mechanics,Postural dysfunction,Pain  Visit Diagnosis: Stiffness of left knee, not elsewhere classified  Acute pain of left knee  Other abnormalities of gait and mobility  Difficulty in walking, not elsewhere classified  Other symptoms and signs involving the musculoskeletal system  Localized edema     Problem List Patient Active Problem List   Diagnosis Date Noted  . Status post total left knee replacement 01/03/2020  .  Unilateral primary osteoarthritis, left knee 03/16/2019  . Osteoarthritis of left hip 07/07/2014  . Status post total replacement of left hip 07/07/2014    JGeorgian Co  Luis Abed, PT, MPT 02/17/2020, 2:29 PM  [facility] [address]  [place], [place], [address] Phone: [phone]   Fax:  [phone]  Name: Philip [name] MRN: [id number] Date of Birth: 02-02-1960

## 2020-02-17 NOTE — Telephone Encounter (Signed)
[employer] forms received. Sent to [employer]. 

## 2020-02-21 ENCOUNTER — Encounter: Payer: Self-pay | Admitting: Physical Therapy

## 2020-02-21 ENCOUNTER — Ambulatory Visit: Payer: BC Managed Care – PPO | Admitting: Physical Therapy

## 2020-02-21 ENCOUNTER — Other Ambulatory Visit: Payer: Self-pay

## 2020-02-21 DIAGNOSIS — R262 Difficulty in walking, not elsewhere classified: Secondary | ICD-10-CM

## 2020-02-21 DIAGNOSIS — R2689 Other abnormalities of gait and mobility: Secondary | ICD-10-CM

## 2020-02-21 DIAGNOSIS — M25562 Pain in left knee: Secondary | ICD-10-CM

## 2020-02-21 DIAGNOSIS — R600 Localized edema: Secondary | ICD-10-CM

## 2020-02-21 DIAGNOSIS — M25662 Stiffness of left knee, not elsewhere classified: Secondary | ICD-10-CM

## 2020-02-21 DIAGNOSIS — R29898 Other symptoms and signs involving the musculoskeletal system: Secondary | ICD-10-CM

## 2020-02-21 NOTE — Therapy (Signed)
[facility] [address]  [place], [place], [address] Phone: [phone]   Fax:  [phone]  Physical Therapy Treatment  Patient Details  Name: Philip [name] MRN: [id number] Date of Birth: 1960/06/15 Referring Provider (PT): Philip Rossetti, MD   Encounter Date: 02/21/2020   PT End of Session - 02/21/20 1020    Visit Number 10    Number of Visits 16    Date for PT Re-Evaluation 03/13/20    Authorization Type BCBS - VL=60 (PT/OT/ST combined)    Progress Note Due on Visit 32   MD PN on visit #6 - 02/10/20   PT Start Time 1020    PT Stop Time 1115    PT Time Calculation (min) 55 min    Activity Tolerance Patient tolerated treatment well    Behavior During Therapy [name] for tasks assessed/performed           Past Medical History:  Diagnosis Date  . Arthritis   . Bulging lumbar disc   . Chest pain on exertion    past 2-3 weeks ---- 12/28/19-patient stated he pulled muscle in chest a long time ago but no recent "chest pain"  . Chronic diarrhea    several years    Past Surgical History:  Procedure Laterality Date  . fractured arm     as child  . SHOULDER ARTHROSCOPY    . TOTAL HIP ARTHROPLASTY Left 07/07/2014   Procedure: LEFT TOTAL HIP ARTHROPLASTY ANTERIOR APPROACH;  Surgeon: Philip Rossetti, MD;  Location: [facility];  Service: Orthopedics;  Laterality: Left;  . TOTAL KNEE ARTHROPLASTY Left 01/03/2020   Procedure: LEFT TOTAL KNEE ARTHROPLASTY;  Surgeon: Philip Rossetti, MD;  Location: [place] [facility];  Service: Orthopedics;  Laterality: Left;  needs RNFA    There were no vitals filed for this visit.   Subjective Assessment - 02/21/20 1024    Subjective Pt reports he has been taking the muscle relaxant more frequently and thinks this is helping. Still notes slight limp with walking w/o cane.    Pertinent History L TKA 01/03/20, L THA, L shoulder scope    Patient Stated Goals "no pain - walk  comfortable w/o limp & be normal"    Currently in Pain? Yes    Pain Score 4     Pain Location Knee    Pain Orientation Left;Anterior    Pain Descriptors / Indicators Tightness;Philip    Pain Type Acute pain;Surgical pain    Pain Frequency Intermittent              [facility] PT Assessment - 02/21/20 1020      Assessment   Medical Diagnosis L TKA    Referring Provider (PT) Philip Rossetti, MD    Onset Date/Surgical Date 01/03/20    Next MD Visit 03/13/20      Observation/Other Assessments   Focus on Therapeutic Outcomes (FOTO)  Knee: FS = 56 (41 point increase since eval)                         [facility] Adult PT Treatment/Exercise - 02/21/20 1020      Exercises   Exercises Knee/Hip      Knee/Hip Exercises: Aerobic   Recumbent Bike partial to full revolutions x 6 min      Knee/Hip Exercises: Standing   Hip Abduction Left;Right;10 reps;Knee straight;Stengthening    Abduction Limitations Fitter (1 black / 1 blue)  Hip Extension Left;Right;10 reps;Stengthening;Knee straight    Extension Limitations Fitter (1 black / 1 blue)    Lateral Step Up Left;10 reps;1 set;Step Height: 6";Hand Hold: 2    Lateral Step Up Limitations + blue TB TKE to increase quad control; 2 pole A for balance    Forward Step Up Left;10 reps;2 sets;Step Height: 6";Step Height: 8";Hand Hold: 2    Forward Step Up Limitations + blue TB TKE to increase quad control; 2 pole A for balance    Step Down Left;10 reps;2 sets;Step Height: 6";Hand Hold: 2    Step Down Limitations lateral & fwd eccentric lowering with light toe tap    Other Standing Knee Exercises L step-over on 6" step x 20; 2 pole assist for balance      Vasopneumatic   Number Minutes Vasopneumatic  10 minutes    Vasopnuematic Location  Knee   Lt   Vasopneumatic Pressure Low    Vasopneumatic Temperature  34                    PT Short Term Goals - 02/17/20 0928      PT SHORT TERM GOAL #1   Title Patient will be  independent with initial HEP    Status Achieved   02/10/20     PT SHORT TERM GOAL #2   Title Patient will demonstrate L knee AROM >/= 5-90 dg to allow for improved gait pattern    Status Achieved   02/17/20 - L knee AROM 5-98            PT Long Term Goals - 02/17/20 0930      PT LONG TERM GOAL #1   Title Patient will be independent with ongoing/advanced HEP for self-management at home    Status Partially Met   02/17/20 - met for current HEP   Target Date 03/13/20      PT LONG TERM GOAL #2   Title Patient will demonstrate L knee AROM >/= 2-115 dg to allow for normal gait and stair mechanics    Status On-going    Target Date 03/13/20      PT LONG TERM GOAL #3   Title Patient will demonstrate improved L LE strength to >/= 4+/5 for improved stability and ease of mobility    Status On-going      PT LONG TERM GOAL #4   Title Patient will ambulate with normal gait pattern w/o AD    Status On-going    Target Date 03/13/20      PT LONG TERM GOAL #5   Title Patient will negotiate stairs reciprocally with normal step pattern w/o limitation due to L knee pain or weakness    Status On-going    Target Date 03/13/20                 Plan - 02/21/20 1027    Clinical Impression Statement Philip [name] able to achieve intermittent full revolutions for the first time on the bike today. He notes he has started taking the muscle relaxant more frequently to help with sensation of cramping/"Charlie horse" type tightness in L thigh. He reports no concerns with latest HEP update but did request some clarification with negative heel gastroc stretching. Therapeutic exercises continuing to target functional knee flexion and extension with good quad control and proximal LE stability with improving step/stair mechanics noted. FOTO revealing a 41 point improvement since eval with overall mobility and function improving.    Comorbidities Multi-joint OA, L THA  2016, L shoulder scope, lumbar DDD    Rehab  Potential Good    PT Frequency 3x / week   3x/wk x 3-4 wks, tapering to 2x/wk for duration of POC   PT Duration 6 weeks    PT Treatment/Interventions ADLs/Self Care Home Management;Cryotherapy;Electrical Stimulation;Iontophoresis 42m/ml Dexamethasone;Moist Heat;DME Instruction;Gait training;Stair training;Functional mobility training;Therapeutic activities;Therapeutic exercise;Balance training;Neuromuscular re-education;Patient/family education;Manual techniques;Passive range of motion;Dry needling;Taping;Vasopneumatic Device;Joint Manipulations    PT Next Visit Plan L knee ROM; LE strengthening; manual therapy for knee ROM and edema reduction with possible trial of kinesiotaping for edema; gait training to normalize gait pattern    PT Home Exercise Plan HH PT HEP: standing heel raise, squat, march, seated hip abduction, LAQ, supine SAQ, heel slide, quad set; MedBridge Access Codes: [email or web address] (02/06/22; [email or web address] (2/8); [email or web address](2/11)    Consulted and Agree with Plan of Care Patient           Patient will benefit from skilled therapeutic intervention in order to improve the following deficits and impairments:  Abnormal gait,Decreased activity tolerance,Decreased balance,Decreased endurance,Decreased knowledge of precautions,Decreased knowledge of use of DME,Decreased mobility,Decreased range of motion,Decreased safety awareness,Decreased scar mobility,Decreased strength,Difficulty walking,Increased edema,Increased fascial restricitons,Increased muscle spasms,Impaired perceived functional ability,Impaired flexibility,Improper body mechanics,Postural dysfunction,Pain  Visit Diagnosis: Stiffness of left knee, not elsewhere classified  Acute pain of left knee  Other abnormalities of gait and mobility  Difficulty in walking, not elsewhere classified  Other symptoms and signs involving the musculoskeletal system  Localized edema     Problem List Patient Active Problem List   Diagnosis Date  Noted  . Status post total left knee replacement 01/03/2020  . Unilateral primary osteoarthritis, left knee 03/16/2019  . Osteoarthritis of left hip 07/07/2014  . Status post total replacement of left hip 07/07/2014    JPercival Spanish PT, MPT 02/21/2020, 12:56 PM  [facility] [place] [address]Phone: [phone]  Fax:  [phone] Name: Philip FINLAYMRN: [id number] of Birth: [birth year]

## 2020-02-24 ENCOUNTER — Other Ambulatory Visit: Payer: Self-pay

## 2020-02-24 ENCOUNTER — Encounter: Payer: Self-pay | Admitting: Physical Therapy

## 2020-02-24 ENCOUNTER — Ambulatory Visit: Payer: BC Managed Care – PPO | Admitting: Physical Therapy

## 2020-02-24 DIAGNOSIS — M25662 Stiffness of left knee, not elsewhere classified: Secondary | ICD-10-CM

## 2020-02-24 DIAGNOSIS — M25562 Pain in left knee: Secondary | ICD-10-CM

## 2020-02-24 DIAGNOSIS — R600 Localized edema: Secondary | ICD-10-CM

## 2020-02-24 DIAGNOSIS — R262 Difficulty in walking, not elsewhere classified: Secondary | ICD-10-CM

## 2020-02-24 DIAGNOSIS — R2689 Other abnormalities of gait and mobility: Secondary | ICD-10-CM

## 2020-02-24 DIAGNOSIS — R29898 Other symptoms and signs involving the musculoskeletal system: Secondary | ICD-10-CM

## 2020-02-24 NOTE — Therapy (Signed)
[facility] [address]  [place], [place], [address] Phone: [phone]   Fax:  [phone]  Physical Therapy Treatment  Patient Details  Name: MCCORMICK MACON MRN: [id number] Date of Birth: 02-25-1960 Referring Provider (PT): Mcarthur Rossetti, MD   Encounter Date: 02/24/2020   PT End of Session - 02/24/20 0917    Visit Number 11    Number of Visits 16    Date for PT Re-Evaluation 03/13/20    Authorization Type BCBS - VL=60 (PT/OT/ST combined)    Progress Note Due on Visit 46   MD PN on visit #6 - 02/10/20   PT Start Time 0917    PT Stop Time 1026    PT Time Calculation (min) 69 min    Activity Tolerance Patient tolerated treatment well    Behavior During Therapy [facility] for tasks assessed/performed           Past Medical History:  Diagnosis Date  . Arthritis   . Bulging lumbar disc   . Chest pain on exertion    past 2-3 weeks ---- 12/28/19-patient stated he pulled muscle in chest a long time ago but no recent "chest pain"  . Chronic diarrhea    several years    Past Surgical History:  Procedure Laterality Date  . fractured arm     as child  . SHOULDER ARTHROSCOPY    . TOTAL HIP ARTHROPLASTY Left 07/07/2014   Procedure: LEFT TOTAL HIP ARTHROPLASTY ANTERIOR APPROACH;  Surgeon: Mcarthur Rossetti, MD;  Location: [facility];  Service: Orthopedics;  Laterality: Left;  . TOTAL KNEE ARTHROPLASTY Left 01/03/2020   Procedure: LEFT TOTAL KNEE ARTHROPLASTY;  Surgeon: Mcarthur Rossetti, MD;  Location: [facility];  Service: Orthopedics;  Laterality: Left;  needs RNFA    There were no vitals filed for this visit.   Subjective Assessment - 02/24/20 0921    Subjective Pt reports his knee still feels weak and stiff, esp in the mornings. Feels his L knee is more aggravated this morning.    Pertinent History L TKA 01/03/20, L THA, L shoulder scope    Patient Stated Goals "no pain - walk comfortable w/o limp & be  normal"    Currently in Pain? Yes    Pain Score 5     Pain Location Knee    Pain Orientation Left              [facility] PT Assessment - 02/24/20 0917      AROM   Left Knee Extension 4   15 in SAQ   Left Knee Flexion 101                         [facility] Adult PT Treatment/Exercise - 02/24/20 0917      Knee/Hip Exercises: Aerobic   Recumbent Bike partial to full revolutions x 2 min; L1 x 5 min      Knee/Hip Exercises: Standing   Terminal Knee Extension Left;15 reps;Theraband;2 sets;Strengthening    Theraband Level (Terminal Knee Extension) Level 4 (Blue)   & black TB     Knee/Hip Exercises: Supine   Quad Sets Left;10 reps;Strengthening    Quad Sets Limitations quad + glute set, pressing heel into peanut ball    Short Arc Quad Sets Left;10 reps;3 sets;AROM    [employer] Limitations 3rd set + 2# cuff wt at ankle    Straight Leg Raises Left;10 reps;2 sets;AROM;Strengthening  Straight Leg Raises Limitations mild quad lag still evident; 2# on 2nd set    Knee Extension Limitations LLLD knee exetension stretch with heel elevated on bolster during 1st 5 minutes of vaso      Vasopneumatic   Number Minutes Vasopneumatic  10 minutes    Vasopnuematic Location  Knee   Lt   Vasopneumatic Pressure Low    Vasopneumatic Temperature  34      Manual Therapy   Manual Therapy Joint mobilization;Soft tissue mobilization;Myofascial release;Passive ROM    Joint Mobilization L patellar mobs all direction - emphasis on sup/inf to facilitate flexion/extension ROM; grade III-IV AP femorotibial mobs with gentle distraciton to faciliate increased extension ROM; tibiofemoral AP mobs at end ROM flexion    Soft tissue mobilization STM & roller stick to L quads    Myofascial Release pin & stretch to L quads    Passive ROM overpressure into end ROM L knee flexion & extension                  PT Education - 02/24/20 1016    Education Details HEP update - Access Code:  [facility]    Person(s) Educated Patient    Methods Explanation;Demonstration;Verbal cues;Tactile cues;Handout    Comprehension Verbalized understanding;Verbal cues required;Tactile cues required;Returned demonstration;Need further instruction            PT Short Term Goals - 02/17/20 0928      PT SHORT TERM GOAL #1   Title Patient will be independent with initial HEP    Status Achieved   02/10/20     PT SHORT TERM GOAL #2   Title Patient will demonstrate L knee AROM >/= 5-90 dg to allow for improved gait pattern    Status Achieved   02/17/20 - L knee AROM 5-98            PT Long Term Goals - 02/24/20 0924      PT LONG TERM GOAL #1   Title Patient will be independent with ongoing/advanced HEP for self-management at home    Status Partially Met   02/17/20 - met for current HEP   Target Date 03/13/20      PT LONG TERM GOAL #2   Title Patient will demonstrate L knee AROM >/= 2-115 dg to allow for normal gait and stair mechanics    Status On-going    Target Date 03/13/20      PT LONG TERM GOAL #3   Title Patient will demonstrate improved L LE strength to >/= 4+/5 for improved stability and ease of mobility    Status On-going    Target Date 03/13/20      PT LONG TERM GOAL #4   Title Patient will ambulate with normal gait pattern w/o AD    Status On-going    Target Date 03/13/20      PT LONG TERM GOAL #5   Title Patient will negotiate stairs reciprocally with normal step pattern w/o limitation due to L knee pain or weakness    Status On-going    Target Date 03/13/20                 Plan - 02/24/20 0925    Clinical Impression Statement Jerad able to complete consistent full revolutions on bike for 5 min today after initial few reps of rocking/partial revolutions. He continues to struggle with open chain knee extension ROM and upon review of SAQ and SLR, it was revealed that he is still using strap AAROM  at home - worked on AROM with cues for quad activation and  increasing hold times w/o holding his breath - new instructions provided for HEP. Pt able to progress TB resistance to black TB today but again requiring cues to increase hold times for better muscle activation. L knee ROM slowly continues to improve with supported extension down to 4, open chain knee extension to 15 and flexion increased to 101. Session concluded with knee positioned in LLLD extension stretch during 1st 5 minutes of vasopnuematic compression.    Comorbidities Multi-joint OA, L THA 2016, L shoulder scope, lumbar DDD    Rehab Potential Good    PT Frequency 3x / week   3x/wk x 3-4 wks, tapering to 2x/wk for duration of POC   PT Duration 6 weeks    PT Treatment/Interventions ADLs/Self Care Home Management;Cryotherapy;Electrical Stimulation;Iontophoresis 68m/ml Dexamethasone;Moist Heat;DME Instruction;Gait training;Stair training;Functional mobility training;Therapeutic activities;Therapeutic exercise;Balance training;Neuromuscular re-education;Patient/family education;Manual techniques;Passive range of motion;Dry needling;Taping;Vasopneumatic Device;Joint Manipulations    PT Next Visit Plan re-assess LE strength - progress  LE strengthening based on continued deficits; review squat technique per pt request; L knee ROM; manual therapy for knee ROM and edema reduction with possible trial of kinesiotaping for edema; gait training to normalize gait pattern    PT Home Exercise Plan HH PT HEP: standing heel raise, squat, march, seated hip abduction, LAQ, supine SAQ, heel slide, quad set; MedBridge Access Codes: [email or web address] (2022-01-31; [email or web address] (2/8); [id number](2/11); [email or web address] (2/18)    Consulted and Agree with Plan of Care Patient           Patient will benefit from skilled therapeutic intervention in order to improve the following deficits and impairments:  Abnormal gait,Decreased activity tolerance,Decreased balance,Decreased endurance,Decreased knowledge of precautions,Decreased knowledge of use  of DME,Decreased mobility,Decreased range of motion,Decreased safety awareness,Decreased scar mobility,Decreased strength,Difficulty walking,Increased edema,Increased fascial restricitons,Increased muscle spasms,Impaired perceived functional ability,Impaired flexibility,Improper body mechanics,Postural dysfunction,Pain  Visit Diagnosis: Stiffness of left knee, not elsewhere classified  Acute pain of left knee  Other abnormalities of gait and mobility  Difficulty in walking, not elsewhere classified  Other symptoms and signs involving the musculoskeletal system  Localized edema     Problem List Patient Active Problem List   Diagnosis Date Noted  . Status post total left knee replacement 01/03/2020  . Unilateral primary osteoarthritis, left knee 03/16/2019  . Osteoarthritis of left hip 07/07/2014  . Status post total replacement of left hip 07/07/2014    JPercival Spanish PT, MPT 02/24/2020, 10:45 AM  [facility] [place] [address]Phone: [phone]  Fax:  [phone] Name: HJUWUAN SEDITAMRN: [id number] of Birth: 5September 25, 1962

## 2020-02-24 NOTE — Patient Instructions (Signed)
   Access Code: [facility] URL: [email or web address] Date: 02/24/2020 Prepared by: Glenetta Hew  Exercises Supine Knee Extension Strengthening - 1 x daily - 7 x weekly - 2 sets - 10 reps - 3-5 sec hold Supine Straight Leg Raises - 1 x daily - 7 x weekly - 2 sets - 10 reps - 3-5 sec hold

## 2020-02-28 ENCOUNTER — Other Ambulatory Visit: Payer: Self-pay

## 2020-02-28 ENCOUNTER — Encounter: Payer: Self-pay | Admitting: Physical Therapy

## 2020-02-28 ENCOUNTER — Ambulatory Visit: Payer: BC Managed Care – PPO | Admitting: Physical Therapy

## 2020-02-28 DIAGNOSIS — R29898 Other symptoms and signs involving the musculoskeletal system: Secondary | ICD-10-CM

## 2020-02-28 DIAGNOSIS — R262 Difficulty in walking, not elsewhere classified: Secondary | ICD-10-CM

## 2020-02-28 DIAGNOSIS — M25662 Stiffness of left knee, not elsewhere classified: Secondary | ICD-10-CM | POA: Diagnosis not present

## 2020-02-28 DIAGNOSIS — M25562 Pain in left knee: Secondary | ICD-10-CM

## 2020-02-28 DIAGNOSIS — R2689 Other abnormalities of gait and mobility: Secondary | ICD-10-CM

## 2020-02-28 DIAGNOSIS — R600 Localized edema: Secondary | ICD-10-CM

## 2020-02-28 NOTE — Therapy (Signed)
[facility] [address]  [place], [place], [address] Phone: [phone]   Fax:  [phone]  Physical Therapy Treatment  Patient Details  Name: Philip [name] MRN: [id number] Date of Birth: Dec 04, 1960 Referring Provider (PT): Mcarthur Rossetti, MD   Encounter Date: 02/28/2020   PT End of Session - 02/28/20 1016    Visit Number 12    Number of Visits 16    Date for PT Re-Evaluation 03/13/20    Authorization Type BCBS - VL=60 (PT/OT/ST combined)    Progress Note Due on Visit 19   MD PN on visit #6 - 02/10/20   PT Start Time 1016    PT Stop Time 1109    PT Time Calculation (min) 53 min    Activity Tolerance Patient tolerated treatment well    Behavior During Therapy [facility] for tasks assessed/performed           Past Medical History:  Diagnosis Date  . Arthritis   . Bulging lumbar disc   . Chest pain on exertion    past 2-3 weeks ---- 12/28/19-patient stated he pulled muscle in chest a long time ago but no recent "chest pain"  . Chronic diarrhea    several years    Past Surgical History:  Procedure Laterality Date  . fractured arm     as child  . SHOULDER ARTHROSCOPY    . TOTAL HIP ARTHROPLASTY Left 07/07/2014   Procedure: LEFT TOTAL HIP ARTHROPLASTY ANTERIOR APPROACH;  Surgeon: Mcarthur Rossetti, MD;  Location: [facility];  Service: Orthopedics;  Laterality: Left;  . TOTAL KNEE ARTHROPLASTY Left 01/03/2020   Procedure: LEFT TOTAL KNEE ARTHROPLASTY;  Surgeon: Mcarthur Rossetti, MD;  Location: [facility];  Service: Orthopedics;  Laterality: Left;  needs RNFA    There were no vitals filed for this visit.   Subjective Assessment - 02/28/20 1019    Subjective Pt reports he has been trying to go w/o the cane for the past 2 days and arrives to PT w/o AD today - notes he feels pretty good w/o the cane but has to be careful not to go too fast or he starts to limp. Pt reports he obtained a home (TENS?) unit  which has been trying to use to help with the feeling of the cramping in his thigh.    Pertinent History L TKA 01/03/20, L THA, L shoulder scope    Patient Stated Goals "no pain - walk comfortable w/o limp & be normal"    Currently in Pain? Yes    Pain Score 4     Pain Location Knee    Pain Orientation Left    Pain Descriptors / Indicators Tightness    Pain Type Acute pain;Surgical pain    Pain Frequency Intermittent              [facility] PT Assessment - 02/28/20 1016      Assessment   Medical Diagnosis L TKA    Referring Provider (PT) Mcarthur Rossetti, MD      Strength   Right Hip Flexion 4+/5    Right Hip Extension 4+/5    Right Hip ABduction 5/5    Right Hip ADduction 4+/5    Left Hip Flexion 4+/5    Left Hip Extension 4+/5    Left Hip ABduction 4+/5    Left Hip ADduction 4/5    Right Knee Flexion 5/5    Right Knee Extension 5/5  Left Knee Flexion 5/5    Left Knee Extension 4+/5    Right Ankle Dorsiflexion 5/5    Left Ankle Dorsiflexion 5/5                         [facility] Adult PT Treatment/Exercise - 02/28/20 1016      Knee/Hip Exercises: Aerobic   Recumbent Bike L1 x 6 min      Knee/Hip Exercises: Standing   Forward Lunges Left;10 reps;5 seconds    Forward Lunges Limitations cues to avoid knee fwd of toes; UE support on TM rail    Functional Squat 15 reps;5 seconds    Functional Squat Limitations UE support on TM rail      Knee/Hip Exercises: Supine   Short Arc Quad Sets Left;10 reps;2 sets    Short Arc Quad Sets Limitations 8" FR; 2nd set + hip ADD ball squeeze to promote increased VMO activation    Terminal Knee Extension Left;10 reps;Strengthening   5" hold   Terminal Knee Extension Limitations into small ball    Straight Leg Raises Left;10 reps;2 sets;AROM;Strengthening    Straight Leg Raises Limitations cues for quad set prior to lift; 2nd set initiated with TKE into small ball prior to lift      Vasopneumatic   Number Minutes  Vasopneumatic  10 minutes    Vasopnuematic Location  Knee   Lt   Vasopneumatic Pressure Low    Vasopneumatic Temperature  34      Manual Therapy   Manual Therapy Joint mobilization;Passive ROM    Joint Mobilization L patellar mobs all directions - emphasis on sup/inf to facilitate flexion/extension ROM    Passive ROM patellar MWM during knee flexion & extension                    PT Short Term Goals - 02/17/20 0928      PT SHORT TERM GOAL #1   Title Patient will be independent with initial HEP    Status Achieved   02/10/20     PT SHORT TERM GOAL #2   Title Patient will demonstrate L knee AROM >/= 5-90 dg to allow for improved gait pattern    Status Achieved   02/17/20 - L knee AROM 5-98            PT Long Term Goals - 02/24/20 0924      PT LONG TERM GOAL #1   Title Patient will be independent with ongoing/advanced HEP for self-management at home    Status Partially Met   02/17/20 - met for current HEP   Target Date 03/13/20      PT LONG TERM GOAL #2   Title Patient will demonstrate L knee AROM >/= 2-115 dg to allow for normal gait and stair mechanics    Status On-going    Target Date 03/13/20      PT LONG TERM GOAL #3   Title Patient will demonstrate improved L LE strength to >/= 4+/5 for improved stability and ease of mobility    Status On-going    Target Date 03/13/20      PT LONG TERM GOAL #4   Title Patient will ambulate with normal gait pattern w/o AD    Status On-going    Target Date 03/13/20      PT LONG TERM GOAL #5   Title Patient will negotiate stairs reciprocally with normal step pattern w/o limitation due to L knee pain or weakness  Status On-going    Target Date 03/13/20                 Plan - 02/28/20 1024    Clinical Impression Statement Philip [name] arrived to PT w/o cane today noting he has been trying to go w/o the cane for the past 2 days - normal gait pattern with reduced speed with pt reporting he feels like he starts to limp if  he walks too fast. Reviewed squat technique at pt request, with pt demonstrating good form and even weight shift. Strength assessed with only mild continued deficits noted, with antigravity quad control still evident in quad lag on SLR, therefore today's strengthening exercises focusing on quad activation for improved TKE control.    Comorbidities Multi-joint OA, L THA 2016, L shoulder scope, lumbar DDD    Rehab Potential Good    PT Frequency 3x / week   3x/wk x 3-4 wks, tapering to 2x/wk for duration of POC   PT Duration 6 weeks    PT Treatment/Interventions ADLs/Self Care Home Management;Cryotherapy;Electrical Stimulation;Iontophoresis 27m/ml Dexamethasone;Moist Heat;DME Instruction;Gait training;Stair training;Functional mobility training;Therapeutic activities;Therapeutic exercise;Balance training;Neuromuscular re-education;Patient/family education;Manual techniques;Passive range of motion;Dry needling;Taping;Vasopneumatic Device;Joint Manipulations    PT Next Visit Plan progress LE strengthening with emphasis on quad control with TKE; L knee ROM; manual therapy for knee ROM and edema reduction with possible trial of kinesiotaping for edema; gait training to normalize gait pattern    PT Home Exercise Plan HH PT HEP: standing heel raise, squat, march, seated hip abduction, LAQ, supine SAQ, heel slide, quad set; MedBridge Access Codes: [email or web address] (2022/02/18; [email or web address] (2/8); [id number](2/11); [email or web address] (2/18)    Consulted and Agree with Plan of Care Patient           Patient will benefit from skilled therapeutic intervention in order to improve the following deficits and impairments:  Abnormal gait,Decreased activity tolerance,Decreased balance,Decreased endurance,Decreased knowledge of precautions,Decreased knowledge of use of DME,Decreased mobility,Decreased range of motion,Decreased safety awareness,Decreased scar mobility,Decreased strength,Difficulty walking,Increased edema,Increased fascial  restricitons,Increased muscle spasms,Impaired perceived functional ability,Impaired flexibility,Improper body mechanics,Postural dysfunction,Pain  Visit Diagnosis: Stiffness of left knee, not elsewhere classified  Acute pain of left knee  Other abnormalities of gait and mobility  Difficulty in walking, not elsewhere classified  Other symptoms and signs involving the musculoskeletal system  Localized edema     Problem List Patient Active Problem List   Diagnosis Date Noted  . Status post total left knee replacement 01/03/2020  . Unilateral primary osteoarthritis, left knee 03/16/2019  . Osteoarthritis of left hip 07/07/2014  . Status post total replacement of left hip 07/07/2014    JPercival Spanish PT, MPT 02/28/2020, 3:28 PM  [facility] [place] [address]Phone: [phone]  Fax:  [phone] Name: Philip WAJDAMRN: [id number] of Birth: [birth year]

## 2020-03-02 ENCOUNTER — Ambulatory Visit: Payer: BC Managed Care – PPO | Admitting: Physical Therapy

## 2020-03-02 ENCOUNTER — Other Ambulatory Visit: Payer: Self-pay

## 2020-03-02 ENCOUNTER — Encounter: Payer: Self-pay | Admitting: Physical Therapy

## 2020-03-02 DIAGNOSIS — R29898 Other symptoms and signs involving the musculoskeletal system: Secondary | ICD-10-CM

## 2020-03-02 DIAGNOSIS — M25662 Stiffness of left knee, not elsewhere classified: Secondary | ICD-10-CM | POA: Diagnosis not present

## 2020-03-02 DIAGNOSIS — R2689 Other abnormalities of gait and mobility: Secondary | ICD-10-CM

## 2020-03-02 DIAGNOSIS — R262 Difficulty in walking, not elsewhere classified: Secondary | ICD-10-CM

## 2020-03-02 DIAGNOSIS — R600 Localized edema: Secondary | ICD-10-CM

## 2020-03-02 DIAGNOSIS — M25562 Pain in left knee: Secondary | ICD-10-CM

## 2020-03-02 NOTE — Patient Instructions (Signed)

## 2020-03-02 NOTE — Therapy (Signed)
[facility] [address]  [place], [place], [address] Phone: [phone]   Fax:  [phone]  Physical Therapy Treatment  Patient Details  Name: Philip [name] MRN: [id number] Date of Birth: 05-15-1960 Referring Provider (PT): Mcarthur Rossetti, MD   Encounter Date: 03/02/2020   PT End of Session - 03/02/20 0930    Visit Number 13    Number of Visits 16    Date for PT Re-Evaluation 03/13/20    Authorization Type BCBS - VL=60 (PT/OT/ST combined)    Progress Note Due on Visit 1   MD PN on visit #6 - 02/10/20   PT Start Time 0930    PT Stop Time 1054    PT Time Calculation (min) 84 min    Activity Tolerance Patient tolerated treatment well    Behavior During Therapy [facility] for tasks assessed/performed           Past Medical History:  Diagnosis Date  . Arthritis   . Bulging lumbar disc   . Chest pain on exertion    past 2-3 weeks ---- 12/28/19-patient stated he pulled muscle in chest a long time ago but no recent "chest pain"  . Chronic diarrhea    several years    Past Surgical History:  Procedure Laterality Date  . fractured arm     as child  . SHOULDER ARTHROSCOPY    . TOTAL HIP ARTHROPLASTY Left 07/07/2014   Procedure: LEFT TOTAL HIP ARTHROPLASTY ANTERIOR APPROACH;  Surgeon: Mcarthur Rossetti, MD;  Location: [facility];  Service: Orthopedics;  Laterality: Left;  . TOTAL KNEE ARTHROPLASTY Left 01/03/2020   Procedure: LEFT TOTAL KNEE ARTHROPLASTY;  Surgeon: Mcarthur Rossetti, MD;  Location: [facility];  Service: Orthopedics;  Laterality: Left;  needs RNFA    There were no vitals filed for this visit.   Subjective Assessment - 03/02/20 0933    Subjective Pt reports ongoing cramping sensation in his L quads with increase pain in his L knee for the past few days - uncertain of trigger for increased pain.    Pertinent History L TKA 01/03/20, L THA, L shoulder scope    Patient Stated Goals "no pain -  walk comfortable w/o limp & be normal"    Currently in Pain? Yes    Pain Score 6    5-6/10   Pain Location Knee    Pain Orientation Left    Pain Descriptors / Indicators Tightness;Cramping;Pressure    Pain Type Acute pain;Surgical pain    Pain Frequency Intermittent              [facility] PT Assessment - 03/02/20 0930      AROM   Left Knee Extension 5    Left Knee Flexion 104                         [facility] Adult PT Treatment/Exercise - 03/02/20 0930      Exercises   Exercises Knee/Hip      Knee/Hip Exercises: Stretches   Quad Stretch Left;3 reps;60 seconds    Quad Stretch Limitations prone with strap + PT overpressure & contract/relax    Hip Flexor Stretch Left;2 reps;60 seconds;30 seconds    Hip Flexor Stretch Limitations mod thomas position with manual overpressure from PT x 2; mod thomas with strap x 2   cues to keep knee at or below hip level     Knee/Hip Exercises: Aerobic  Recumbent Bike partial to full revolutions x 6 min      Modalities   Modalities Vasopneumatic;Electrical Stimulation;Moist Heat      Moist Heat Therapy   Number Minutes Moist Heat 15 Minutes    Moist Heat Location Other (comment)   L thigh     Electrical Stimulation   Electrical Stimulation Location L quads    Electrical Stimulation Action IFC    Electrical Stimulation Parameters 80-150 Hz, intensity to pt tolerance x 15'    Electrical Stimulation Goals Pain;Tone      Vasopneumatic   Number Minutes Vasopneumatic  10 minutes    Vasopnuematic Location  Knee   Lt   Vasopneumatic Pressure Low    Vasopneumatic Temperature  34      Manual Therapy   Manual Therapy Joint mobilization;Soft tissue mobilization;Myofascial release;Passive ROM;Muscle Energy Technique    Manual therapy comments skilled palpation and monitoring during DN    Joint Mobilization L patellar mobs all directions - emphasis on sup/inf to facilitate flexion/extension ROM    Soft tissue mobilization STM/DTM to L  quads    Myofascial Release manual TPR and pin & stretch to L quads    Passive ROM manual L mod thomas quad/hip flexor stretch with light overpressure 2 x 60 sec    Muscle Energy Technique contract/relax into L knee flexion during prone quad stretch            Trigger Point Dry Needling - 03/02/20 0930    Consent Given? Yes    Education Handout Provided Yes    Muscles Treated Lower Quadrant Quadriceps;Rectus femoris;Vastus lateralis;Vastus intermedius   Lt   Electrical Stimulation Performed with Dry Needling Yes    E-stim with Dry Needling Details Lt VL & VI/RF    Quadriceps Response Twitch response elicited;Palpable increased muscle length    Rectus femoris Response Twitch response elicited;Palpable increased muscle length    Vastus lateralis Response Twitch response elicited;Palpable increased muscle length    Vastus intermedius Response Twitch response elicited;Palpable increased muscle length                PT Education - 03/02/20 0944    Education Details DN rational, procedures, outcomes, potential side effects, and recommended post-treatment exercises/activity    Person(s) Educated Patient    Methods Explanation;Handout    Comprehension Verbalized understanding            PT Short Term Goals - 02/17/20 0928      PT SHORT TERM GOAL #1   Title Patient will be independent with initial HEP    Status Achieved   02/10/20     PT SHORT TERM GOAL #2   Title Patient will demonstrate L knee AROM >/= 5-90 dg to allow for improved gait pattern    Status Achieved   02/17/20 - L knee AROM 5-98            PT Long Term Goals - 03/02/20 0937      PT LONG TERM GOAL #1   Title Patient will be independent with ongoing/advanced HEP for self-management at home    Status Partially Met   02/17/20 - met for current HEP   Target Date 03/13/20      PT LONG TERM GOAL #2   Title Patient will demonstrate L knee AROM >/= 2-115 dg to allow for normal gait and stair mechanics     Status On-going    Target Date 03/13/20      PT LONG TERM GOAL #3  Title Patient will demonstrate improved L LE strength to >/= 4+/5 for improved stability and ease of mobility    Status On-going    Target Date 03/13/20      PT LONG TERM GOAL #4   Title Patient will ambulate with normal gait pattern w/o AD    Status On-going   03/02/20 - increased limp today   Target Date 03/13/20      PT LONG TERM GOAL #5   Title Patient will negotiate stairs reciprocally with normal step pattern w/o limitation due to L knee pain or weakness    Status On-going    Target Date 03/13/20                 Plan - 03/02/20 1054    Clinical Impression Statement Iwao walked into PT clinic today with more pronounced limp, noting increased cramping sensation in L quads and increased pain in L knee today w/o known trigger. Increased difficulty achieving full revolutions on bike initially during warm-up but loosening up by end of warm-up. Palpation of L quads revealing increased muscle tension with taut bands/TPs in VL, VI & RF which appeared amenable to DN. After explanation of DN rational, procedures, outcomes and potential side effects, patient verbalized consent to DN treatment in conjunction with manual STM/DTM and TPR to reduce ttp/muscle tension. Muscles treated include left VL, VI & RF, with estim incorporated during DN. DN produced normal response with good twitches elicited resulting in palpable reduction in pain/ttp and muscle tension. DN followed by manual stretching including contract/relax MET with PT providing clarification of ways for his wife to assist with stretching per pt inquiry. Pt noting reduction in tightness/cramping in L quads with decreased limp and slight improvement in L knee flexion ROM noted by end of session. Pt educated to expect mild to moderate muscle soreness for up to 24-48 hrs and instructed to continue prescribed home exercise program and current activity level with pt  verbalizing understanding of theses instructions. Session concluded with estim and moist heat to L quads to promote further muscle relaxation/relief from muscle spasm, with vasopnuematic compression to L knee to reduce edema/pain.    Comorbidities Multi-joint OA, L THA 2016, L shoulder scope, lumbar DDD    Rehab Potential Good    PT Frequency 3x / week   3x/wk x 3-4 wks, tapering to 2x/wk for duration of POC   PT Duration 6 weeks    PT Treatment/Interventions ADLs/Self Care Home Management;Cryotherapy;Electrical Stimulation;Iontophoresis 42m/ml Dexamethasone;Moist Heat;DME Instruction;Gait training;Stair training;Functional mobility training;Therapeutic activities;Therapeutic exercise;Balance training;Neuromuscular re-education;Patient/family education;Manual techniques;Passive range of motion;Dry needling;Taping;Vasopneumatic Device;Joint Manipulations    PT Next Visit Plan assess response to DN, progress LE strengthening with emphasis on quad control with TKE; L knee ROM; manual therapy for knee ROM and edema reduction with possible trial of kinesiotaping for edema; gait training to normalize gait pattern    PT Home Exercise Plan HH PT HEP: standing heel raise, squat, march, seated hip abduction, LAQ, supine SAQ, heel slide, quad set; MedBridge Access Codes: [email or web address] (02-09-2022; [email or web address] (2/8); [id number](2/11); [email or web address] (2/18)    Consulted and Agree with Plan of Care Patient           Patient will benefit from skilled therapeutic intervention in order to improve the following deficits and impairments:  Abnormal gait,Decreased activity tolerance,Decreased balance,Decreased endurance,Decreased knowledge of precautions,Decreased knowledge of use of DME,Decreased mobility,Decreased range of motion,Decreased safety awareness,Decreased scar mobility,Decreased strength,Difficulty walking,Increased edema,Increased fascial restricitons,Increased muscle spasms,Impaired perceived functional ability,Impaired  flexibility,Improper body mechanics,Postural  dysfunction,Pain  Visit Diagnosis: Stiffness of left knee, not elsewhere classified  Acute pain of left knee  Other abnormalities of gait and mobility  Difficulty in walking, not elsewhere classified  Other symptoms and signs involving the musculoskeletal system  Localized edema     Problem List Patient Active Problem List   Diagnosis Date Noted  . Status post total left knee replacement 01/03/2020  . Unilateral primary osteoarthritis, left knee 03/16/2019  . Osteoarthritis of left hip 07/07/2014  . Status post total replacement of left hip 07/07/2014    Percival Spanish, PT, MPT 03/02/2020, 12:16 PM  [facility] [address]  [place], [place], [address] Phone: [phone]   Fax:  [phone]  Name: RODRIC PUNCH MRN: [id number] Date of Birth: [birth year]

## 2020-03-06 ENCOUNTER — Encounter: Payer: Self-pay | Admitting: Physical Therapy

## 2020-03-06 ENCOUNTER — Other Ambulatory Visit: Payer: Self-pay

## 2020-03-06 ENCOUNTER — Ambulatory Visit: Payer: BC Managed Care – PPO | Attending: Orthopaedic Surgery | Admitting: Physical Therapy

## 2020-03-06 DIAGNOSIS — R600 Localized edema: Secondary | ICD-10-CM | POA: Insufficient documentation

## 2020-03-06 DIAGNOSIS — M25662 Stiffness of left knee, not elsewhere classified: Secondary | ICD-10-CM | POA: Insufficient documentation

## 2020-03-06 DIAGNOSIS — R29898 Other symptoms and signs involving the musculoskeletal system: Secondary | ICD-10-CM | POA: Diagnosis present

## 2020-03-06 DIAGNOSIS — M25562 Pain in left knee: Secondary | ICD-10-CM | POA: Diagnosis present

## 2020-03-06 DIAGNOSIS — R2689 Other abnormalities of gait and mobility: Secondary | ICD-10-CM | POA: Diagnosis present

## 2020-03-06 DIAGNOSIS — R262 Difficulty in walking, not elsewhere classified: Secondary | ICD-10-CM | POA: Diagnosis present

## 2020-03-06 NOTE — Therapy (Signed)
[facility] [address]  [place], [place], [address] Phone: [phone]   Fax:  [phone]  Physical Therapy Treatment  Patient Details  Name: Philip [name] MRN: [id number] Date of Birth: [birth year] Referring Provider (PT): Mcarthur Rossetti, MD   Encounter Date: 03/06/2020   PT End of Session - 03/06/20 1020    Visit Number 14    Number of Visits 16    Date for PT Re-Evaluation 03/13/20    Authorization Type BCBS - VL=60 (PT/OT/ST combined)    Progress Note Due on Visit 71   MD PN on visit #6 - 02/10/20   PT Start Time 1020    PT Stop Time 1117    PT Time Calculation (min) 57 min    Activity Tolerance Patient tolerated treatment well    Behavior During Therapy [facility] for tasks assessed/performed           Past Medical History:  Diagnosis Date  . Arthritis   . Bulging lumbar disc   . Chest pain on exertion    past 2-3 weeks ---- 12/28/19-patient stated he pulled muscle in chest a long time ago but no recent "chest pain"  . Chronic diarrhea    several years    Past Surgical History:  Procedure Laterality Date  . fractured arm     as child  . SHOULDER ARTHROSCOPY    . TOTAL HIP ARTHROPLASTY Left 07/07/2014   Procedure: LEFT TOTAL HIP ARTHROPLASTY ANTERIOR APPROACH;  Surgeon: Mcarthur Rossetti, MD;  Location: [facility];  Service: Orthopedics;  Laterality: Left;  . TOTAL KNEE ARTHROPLASTY Left 01/03/2020   Procedure: LEFT TOTAL KNEE ARTHROPLASTY;  Surgeon: Mcarthur Rossetti, MD;  Location: [place];  Service: Orthopedics;  Laterality: Left;  needs RNFA    There were no vitals filed for this visit.   Subjective Assessment - 03/06/20 1025    Subjective Pt notes benefit from DN noting no further cramping sensation and improving ease of ROM, but still notes senstation of tightness, esp when walking.    Pertinent History L TKA 01/03/20, L THA, L shoulder scope    Patient Stated Goals "no pain - walk  comfortable w/o limp & be normal"    Currently in Pain? Yes    Pain Score 4     Pain Location Knee    Pain Orientation Left    Pain Descriptors / Indicators Tightness    Pain Type Acute pain;Surgical pain    Pain Frequency Intermittent              [facility] PT Assessment - 03/06/20 1020      AROM   Left Knee Extension 3   15 in LAQ   Left Knee Flexion 105                         [facility] Adult PT Treatment/Exercise - 03/06/20 1020      Exercises   Exercises Knee/Hip      Knee/Hip Exercises: Stretches   Sports administrator Left;3 reps;60 seconds    Quad Stretch Limitations prone with strap + PT overpressure & contract/relax    Other Knee/Hip Stretches Reviewed seated knee extension prop with foot resting in chair 2 x 60 sec - cues for upright trunk position to promote HS stretch & strap for DF stretch      Knee/Hip Exercises: Aerobic   Recumbent Bike L1 x 6 min  Knee/Hip Exercises: Prone   Prone Knee Hang 5 minutes;Weights    Prone Knee Hang Weights (lbs) 4    Prone Knee Hang Limitations PT performing STM & IASTM with roller stick to HS      Modalities   Modalities Vasopneumatic      Vasopneumatic   Number Minutes Vasopneumatic  10 minutes    Vasopnuematic Location  Knee   Lt   Vasopneumatic Pressure Low    Vasopneumatic Temperature  34      Manual Therapy   Manual Therapy Joint mobilization;Soft tissue mobilization;Myofascial release;Passive ROM;Muscle Energy Technique    Joint Mobilization L patellar mobs all directions - emphasis on sup/inf to facilitate flexion/extension ROM    Soft tissue mobilization STM & roller stick to L HS in prone hang    Myofascial Release manual TPR and pin & stretch to L HS    Passive ROM manual overpressure into prone quad stretch and prone hang for extension    Muscle Energy Technique contract/relax into L knee flexion during prone quad stretch & L knee extension in prone hang                    PT Short Term  Goals - 02/17/20 0928      PT SHORT TERM GOAL #1   Title Patient will be independent with initial HEP    Status Achieved   02/10/20     PT SHORT TERM GOAL #2   Title Patient will demonstrate L knee AROM >/= 5-90 dg to allow for improved gait pattern    Status Achieved   02/17/20 - L knee AROM 5-98            PT Long Term Goals - 03/02/20 0937      PT LONG TERM GOAL #1   Title Patient will be independent with ongoing/advanced HEP for self-management at home    Status Partially Met   02/17/20 - met for current HEP   Target Date 03/13/20      PT LONG TERM GOAL #2   Title Patient will demonstrate L knee AROM >/= 2-115 dg to allow for normal gait and stair mechanics    Status On-going    Target Date 03/13/20      PT LONG TERM GOAL #3   Title Patient will demonstrate improved L LE strength to >/= 4+/5 for improved stability and ease of mobility    Status On-going    Target Date 03/13/20      PT LONG TERM GOAL #4   Title Patient will ambulate with normal gait pattern w/o AD    Status On-going   03/02/20 - increased limp today   Target Date 03/13/20      PT LONG TERM GOAL #5   Title Patient will negotiate stairs reciprocally with normal step pattern w/o limitation due to L knee pain or weakness    Status On-going    Target Date 03/13/20                 Plan - 03/06/20 1027    Clinical Impression Statement Philip [name] reporting resolution of cramping sensation following DN to quads last visit but noted ongoing tightness in quads, esp when walking. Continued manual therapy emphasis on STM and stretching to facilitate increased L knee flexion and extension ROM with pt noting he now can get past the feeling of muscle tightness limiting ROM to more joint stiffness restriction - will continue to address both in effort to further  progress ROM.    Comorbidities Multi-joint OA, L THA 2016, L shoulder scope, lumbar DDD    Rehab Potential Good    PT Frequency 3x / week   3x/wk x 3-4 wks,  tapering to 2x/wk for duration of POC   PT Duration 6 weeks    PT Treatment/Interventions ADLs/Self Care Home Management;Cryotherapy;Electrical Stimulation;Iontophoresis 3m/ml Dexamethasone;Moist Heat;DME Instruction;Gait training;Stair training;Functional mobility training;Therapeutic activities;Therapeutic exercise;Balance training;Neuromuscular re-education;Patient/family education;Manual techniques;Passive range of motion;Dry needling;Taping;Vasopneumatic Device;Joint Manipulations    PT Next Visit Plan MD PN for visit on 03/13/20; progress LE strengthening with emphasis on quad control with TKE; L knee ROM; manual therapy for knee ROM and edema reduction with possible trial of kinesiotaping for edema; gait training to normalize gait pattern    PT Home Exercise Plan HH PT HEP: standing heel raise, squat, march, seated hip abduction, LAQ, supine SAQ, heel slide, quad set; MedBridge Access Codes: [email or web address] (February 19, 2022; [email or web address] (2/8); [id number](2/11); [email or web address] (2/18)    Consulted and Agree with Plan of Care Patient           Patient will benefit from skilled therapeutic intervention in order to improve the following deficits and impairments:  Abnormal gait,Decreased activity tolerance,Decreased balance,Decreased endurance,Decreased knowledge of precautions,Decreased knowledge of use of DME,Decreased mobility,Decreased range of motion,Decreased safety awareness,Decreased scar mobility,Decreased strength,Difficulty walking,Increased edema,Increased fascial restricitons,Increased muscle spasms,Impaired perceived functional ability,Impaired flexibility,Improper body mechanics,Postural dysfunction,Pain  Visit Diagnosis: Stiffness of left knee, not elsewhere classified  Acute pain of left knee  Other abnormalities of gait and mobility  Difficulty in walking, not elsewhere classified  Other symptoms and signs involving the musculoskeletal system  Localized edema     Problem List Patient Active  Problem List   Diagnosis Date Noted  . Status post total left knee replacement 01/03/2020  . Unilateral primary osteoarthritis, left knee 03/16/2019  . Osteoarthritis of left hip 07/07/2014  . Status post total replacement of left hip 07/07/2014    JPercival Spanish PT, MPT 03/06/2020, 12:31 PM  [facility] [place] [address]Phone: [phone]  Fax:  [phone] Name: HKORIE BRABSONMRN: [id number] of Birth: 502-Dec-1962

## 2020-03-09 ENCOUNTER — Ambulatory Visit: Payer: BC Managed Care – PPO | Admitting: Physical Therapy

## 2020-03-09 ENCOUNTER — Other Ambulatory Visit: Payer: Self-pay

## 2020-03-09 ENCOUNTER — Encounter: Payer: Self-pay | Admitting: Physical Therapy

## 2020-03-09 DIAGNOSIS — R2689 Other abnormalities of gait and mobility: Secondary | ICD-10-CM

## 2020-03-09 DIAGNOSIS — R29898 Other symptoms and signs involving the musculoskeletal system: Secondary | ICD-10-CM

## 2020-03-09 DIAGNOSIS — R600 Localized edema: Secondary | ICD-10-CM

## 2020-03-09 DIAGNOSIS — R262 Difficulty in walking, not elsewhere classified: Secondary | ICD-10-CM

## 2020-03-09 DIAGNOSIS — M25662 Stiffness of left knee, not elsewhere classified: Secondary | ICD-10-CM

## 2020-03-09 DIAGNOSIS — M25562 Pain in left knee: Secondary | ICD-10-CM

## 2020-03-09 NOTE — Therapy (Signed)
[facility] [address]  [place], [place], [address] Phone: [phone]   Fax:  [phone]  Physical Therapy Treatment / Progress Note / Recert  Patient Details  Name: Philip [name] MRN: [id number] Date of Birth: 08-03-1960 Referring Provider (PT): Mcarthur Rossetti, MD   Progress Note  Reporting Period 02/10/2020 to 03/09/2020  See note below for Objective Data and Assessment of Progress/Goals.      Encounter Date: 03/09/2020   PT End of Session - 03/09/20 0932    Visit Number 15    Number of Visits 23    Date for PT Re-Evaluation 04/06/20    Authorization Type BCBS - VL=60 (PT/OT/ST combined)    Progress Note Due on Visit --    PT Start Time 0932    PT Stop Time 1032    PT Time Calculation (min) 60 min    Activity Tolerance Patient tolerated treatment well    Behavior During Therapy WFL for tasks assessed/performed           Past Medical History:  Diagnosis Date  . Arthritis   . Bulging lumbar disc   . Chest pain on exertion    past 2-3 weeks ---- 12/28/19-patient stated he pulled muscle in chest a long time ago but no recent "chest pain"  . Chronic diarrhea    several years    Past Surgical History:  Procedure Laterality Date  . fractured arm     as child  . SHOULDER ARTHROSCOPY    . TOTAL HIP ARTHROPLASTY Left 07/07/2014   Procedure: LEFT TOTAL HIP ARTHROPLASTY ANTERIOR APPROACH;  Surgeon: Mcarthur Rossetti, MD;  Location: [facility];  Service: Orthopedics;  Laterality: Left;  . TOTAL KNEE ARTHROPLASTY Left 01/03/2020   Procedure: LEFT TOTAL KNEE ARTHROPLASTY;  Surgeon: Mcarthur Rossetti, MD;  Location: [place];  Service: Orthopedics;  Laterality: Left;  needs RNFA    There were no vitals filed for this visit.   Subjective Assessment - 03/09/20 0934    Subjective Pt noting pain up to 6-7/10  in front of kneecap for the past few nights making sleeping more uncomfortable. Meds don't  seem to help.    Pertinent History L TKA 01/03/20, L THA, L shoulder scope    Patient Stated Goals "no pain - walk comfortable w/o limp & be normal"    Currently in Pain? Yes    Pain Score 4     Pain Location Knee    Pain Orientation Left    Pain Descriptors / Indicators Tightness    Pain Type Acute pain;Surgical pain    Pain Frequency Intermittent              [facility] PT Assessment - 03/09/20 0932      Assessment   Medical Diagnosis L TKA    Referring Provider (PT) Mcarthur Rossetti, MD    Onset Date/Surgical Date 01/03/20    Next MD Visit 03/13/20      Observation/Other Assessments   Focus on Therapeutic Outcomes (FOTO)  Knee: FS = 46 (31 point increase since eval)      AROM   Left Knee Extension 4   14 in LAQ   Left Knee Flexion 106      PROM   Left Knee Extension 3    Left Knee Flexion 109      Strength   Right Hip Flexion 5/5    Right Hip Extension 4+/5   limited by HS  cramp   Right Hip ABduction 5/5    Right Hip ADduction 4+/5    Left Hip Flexion 5/5    Left Hip Extension 4+/5    Left Hip ABduction 5/5    Left Hip ADduction 4/5    Right Knee Flexion 5/5    Right Knee Extension 5/5    Left Knee Flexion 5/5    Left Knee Extension 4+/5    Right Ankle Dorsiflexion 5/5    Left Ankle Dorsiflexion 5/5                         [facility] Adult PT Treatment/Exercise - 03/09/20 0932      Ambulation/Gait   Ambulation/Gait Assistance 7: Independent    Assistive device None    Gait Pattern Step-through pattern;Decreased hip/knee flexion - left;Decreased stance time - left;Left flexed knee in stance;Trunk flexed;Lateral trunk lean to left    Stairs Assistance 5: Supervision;6: Modified independent (Device/Increase time)    Stairs Assistance Details (indicate cue type and reason) cues to reduce pull on railing allowing for leg muscles to create lift    Stair Management Technique One rail Right;Alternating pattern;Forwards    Number of Stairs 14   x 2  sets   Height of Stairs 7    Gait Comments Intermittent limp apparent during gait especially upon initiation of gait with tendency for increased L fwd/lateral trunk lean still present, likely in part due issues with his back. Improving L hip and knee flexion on stair ascent resulting in decreased tendency for hip circumduction but continued increased effort noted for lift when leasing with L LE and continued use of R Tredelenburg drop noted on stair descent due to decreased quad control and limited knee flexion ROM making reciprocal descent more difficult.      Exercises   Exercises Knee/Hip      Knee/Hip Exercises: Aerobic   Recumbent Bike L2 x 6 min      Knee/Hip Exercises: Supine   Quad Sets Left;5 reps;2 sets;Strengthening;AROM    Quad Sets Limitations with PT providing longitudinal distraction to facilitate increased extension ROM    Heel Slides Left;10 reps;2 sets;AROM    Other Supine Knee/Hip Exercises LLLD L knee extension stretch with ankle resting on foam roll x 6 min      Vasopneumatic   Number Minutes Vasopneumatic  10 minutes    Vasopnuematic Location  Knee   Lt   Vasopneumatic Pressure High    Vasopneumatic Temperature  34      Manual Therapy   Manual Therapy Joint mobilization;Soft tissue mobilization;Myofascial release;Passive ROM    Joint Mobilization L patellar mobs all direction - emphasis on sup/inf to facilitate flexion/extension ROM; grade III-IV A/P femorotibial mobs with gentle distraciton to faciliate increased extension ROM; grade III-IV A/P tibiofemoral mobs in hooklying to promote increased flexion ROM    Soft tissue mobilization STM to R HS for cramp relief    Myofascial Release manual TPR to R proximal HS for cramp relief    Passive ROM manual overpressure into end range L knee flexion & extension                    PT Short Term Goals - 02/17/20 0928      PT SHORT TERM GOAL #1   Title Patient will be independent with initial HEP    Status  Achieved   02/10/20     PT SHORT TERM GOAL #2   Title Patient  will demonstrate L knee AROM >/= 5-90 dg to allow for improved gait pattern    Status Achieved   02/17/20 - L knee AROM 5-98            PT Long Term Goals - 03/09/20 0941      PT LONG TERM GOAL #1   Title Patient will be independent with ongoing/advanced HEP for self-management at home    Status Partially Met   03/09/20: met for current HEP   Target Date 04/06/20      PT LONG TERM GOAL #2   Title Patient will demonstrate L knee AROM >/= 2-115 dg to allow for normal gait and stair mechanics    Status On-going   03/09/20: AROM 4-106 after joint mobs   Target Date 04/06/20      PT LONG TERM GOAL #3   Title Patient will demonstrate improved L LE strength to >/= 4+/5 for improved stability and ease of mobility    Status Partially Met   03/09/20: met except L hip adduction 4/5   Target Date 04/06/20      PT LONG TERM GOAL #4   Title Patient will ambulate with normal gait pattern w/o AD    Status Partially Met   03/09/20: pt continues to demonstrate intermittent limp   Target Date 04/06/20      PT LONG TERM GOAL #5   Title Patient will negotiate stairs reciprocally with normal step pattern w/o limitation due to L knee pain or weakness    Status Partially Met   03/09/20: able to ascend/descend reciprocally but with increased effort and some hip substitution   Target Date 04/06/20                 Plan - 03/09/20 1022    Clinical Impression Statement Keo has demonstrated good progress with PT s/p L TKA. He has weaned from the RW and only rarely uses the cane at present but does still demonstrate an intermittent limp apparent during gait especially upon initiation of gait with tendency for increased L fwd/lateral trunk lean still present, likely in part due issues with his back. Improving L hip and knee flexion on stair ascent resulting in decreased tendency for hip circumduction but continued increased effort noted for lift  when leasing with L LE and continued use of R Tredelenburg drop noted on stair descent due to decreased quad control and limited knee flexion ROM making reciprocal descent more difficult. L knee AROM now 4-106 after exercises and joint mobilizations but pt notes knee will stiffen up quickly. Continued muscle tightness in quads, HS and gastroc continues to impact ROM but pt noting relief of prior muscle cramping since initial trial of DN to quads - he may benefit from further DN to facilitated further reduction in muscle tension/tightness for improve stretching tolerance and increased ROM. Overall strength improving with average MMT >/= 4+/5 with exception of L hip adductors 4/5 and quad control still limited with L knee extension in LAQ. All STGs met but LTG only partially met or ongoing. Given ongoing deficits with gait, ROM, strength and muscle tension/tightness, pt will likely benefit from extension of PT POC for additional 2x/wk for up to 4 more weeks.    Comorbidities Multi-joint OA, L THA 2016, L shoulder scope, lumbar DDD    Rehab Potential Good    PT Frequency 2x / week    PT Duration 4 weeks    PT Treatment/Interventions ADLs/Self Care Home Management;Cryotherapy;Electrical Stimulation;Iontophoresis 94m/ml Dexamethasone;Moist Heat;DME  Instruction;Gait training;Stair training;Functional mobility training;Therapeutic activities;Therapeutic exercise;Balance training;Neuromuscular re-education;Patient/family education;Manual techniques;Passive range of motion;Dry needling;Taping;Vasopneumatic Device;Joint Manipulations    PT Next Visit Plan progress LE strengthening with emphasis on quad control with TKE; L knee ROM; manual therapy including joint mobs & potential further DN for knee ROM, MT/STM for edema reduction with possible trial of kinesiotaping for edema; gait training to further normalize gait pattern    PT Home Exercise Plan HH PT HEP: standing heel raise, squat, march, seated hip abduction,  LAQ, supine SAQ, heel slide, quad set; MedBridge Access Codes: [email or web address] 15-Feb-2022); [email or web address] (2/8); [id number] (2/11); [email or web address] (2/18)    Consulted and Agree with Plan of Care Patient           Patient will benefit from skilled therapeutic intervention in order to improve the following deficits and impairments:  Abnormal gait,Decreased activity tolerance,Decreased balance,Decreased endurance,Decreased knowledge of precautions,Decreased knowledge of use of DME,Decreased mobility,Decreased range of motion,Decreased safety awareness,Decreased scar mobility,Decreased strength,Difficulty walking,Increased edema,Increased fascial restricitons,Increased muscle spasms,Impaired perceived functional ability,Impaired flexibility,Improper body mechanics,Postural dysfunction,Pain  Visit Diagnosis: Stiffness of left knee, not elsewhere classified  Acute pain of left knee  Other abnormalities of gait and mobility  Difficulty in walking, not elsewhere classified  Other symptoms and signs involving the musculoskeletal system  Localized edema     Problem List Patient Active Problem List   Diagnosis Date Noted  . Status post total left knee replacement 01/03/2020  . Unilateral primary osteoarthritis, left knee 03/16/2019  . Osteoarthritis of left hip 07/07/2014  . Status post total replacement of left hip 07/07/2014    Percival Spanish, PT, MPT 03/09/2020, 12:23 PM  [facility] [address]  [place], [place], [address] Phone: [phone]   Fax:  [phone]  Name: DRAVIN LANCE MRN: [id number] Date of Birth: February 25, 1960

## 2020-03-13 ENCOUNTER — Other Ambulatory Visit: Payer: Self-pay

## 2020-03-13 ENCOUNTER — Ambulatory Visit (INDEPENDENT_AMBULATORY_CARE_PROVIDER_SITE_OTHER): Payer: BC Managed Care – PPO | Admitting: Orthopaedic Surgery

## 2020-03-13 ENCOUNTER — Ambulatory Visit: Payer: BC Managed Care – PPO | Admitting: Physical Therapy

## 2020-03-13 ENCOUNTER — Encounter: Payer: Self-pay | Admitting: Physical Therapy

## 2020-03-13 DIAGNOSIS — M25662 Stiffness of left knee, not elsewhere classified: Secondary | ICD-10-CM

## 2020-03-13 DIAGNOSIS — Z96652 Presence of left artificial knee joint: Secondary | ICD-10-CM

## 2020-03-13 DIAGNOSIS — M25562 Pain in left knee: Secondary | ICD-10-CM

## 2020-03-13 DIAGNOSIS — R2689 Other abnormalities of gait and mobility: Secondary | ICD-10-CM

## 2020-03-13 DIAGNOSIS — R600 Localized edema: Secondary | ICD-10-CM

## 2020-03-13 DIAGNOSIS — R262 Difficulty in walking, not elsewhere classified: Secondary | ICD-10-CM

## 2020-03-13 DIAGNOSIS — R29898 Other symptoms and signs involving the musculoskeletal system: Secondary | ICD-10-CM

## 2020-03-13 NOTE — Therapy (Signed)
[facility] [address]  [place], [place], [address] Phone: [phone]   Fax:  [phone]  Physical Therapy Treatment  Patient Details  Name: Philip [name] MRN: [id number] Date of Birth: [birth year] Referring Provider (PT): Mcarthur Rossetti, MD   Encounter Date: 03/13/2020   PT End of Session - 03/13/20 1016    Visit Number 16    Number of Visits 23    Date for PT Re-Evaluation 04/06/20    Authorization Type BCBS - VL=60 (PT/OT/ST combined)    PT Start Time 1016    PT Stop Time 1120    PT Time Calculation (min) 64 min    Activity Tolerance Patient tolerated treatment well    Behavior During Therapy [facility] for tasks assessed/performed           Past Medical History:  Diagnosis Date  . Arthritis   . Bulging lumbar disc   . Chest pain on exertion    past 2-3 weeks ---- 12/28/19-patient stated he pulled muscle in chest a long time ago but no recent "chest pain"  . Chronic diarrhea    several years    Past Surgical History:  Procedure Laterality Date  . fractured arm     as child  . SHOULDER ARTHROSCOPY    . TOTAL HIP ARTHROPLASTY Left 07/07/2014   Procedure: LEFT TOTAL HIP ARTHROPLASTY ANTERIOR APPROACH;  Surgeon: Mcarthur Rossetti, MD;  Location: [facility];  Service: Orthopedics;  Laterality: Left;  . TOTAL KNEE ARTHROPLASTY Left 01/03/2020   Procedure: LEFT TOTAL KNEE ARTHROPLASTY;  Surgeon: Mcarthur Rossetti, MD;  Location: [facility];  Service: Orthopedics;  Laterality: Left;  needs RNFA    There were no vitals filed for this visit.   Subjective Assessment - 03/13/20 1023    Subjective Pt reports MD is keeping him out of work for 4 more weeks with continued "aggressive PT" to work on edema and ROM. He will f/u with MD again on 03/28/20.    Pertinent History L TKA 01/03/20, L THA, L shoulder scope    Patient Stated Goals "no pain - walk comfortable w/o limp & be normal"    Currently in Pain?  Yes              [facility] PT Assessment - 03/13/20 1016      Assessment   Medical Diagnosis L TKA    Referring Provider (PT) Mcarthur Rossetti, MD    Onset Date/Surgical Date 01/03/20    Next MD Visit 03/28/20                         [facility] Adult PT Treatment/Exercise - 03/13/20 1016      Exercises   Exercises Knee/Hip      Knee/Hip Exercises: Stretches   Passive Hamstring Stretch Left;2 reps;60 seconds    Passive Hamstring Stretch Limitations manual by PT with overpressure into knee extension    Hip Flexor Stretch Left;2 reps;60 seconds    Hip Flexor Stretch Limitations mod thomas position with manual overpressure from PT and retrograde massage and pin& stretch to mid/lateral quads; contract/relax into knee flexion on 2nd rep      Knee/Hip Exercises: Aerobic   Nustep L5 x 6 min      Knee/Hip Exercises: Supine   Quad Sets Left;5 reps    Quad Sets Limitations in conjunction with PT providing grade III-IV A/P femorotibial mobs      Modalities  Modalities Vasopneumatic;Moist Heat      Moist Heat Therapy   Number Minutes Moist Heat 15 Minutes    Moist Heat Location --   Lt quads & HS     Vasopneumatic   Number Minutes Vasopneumatic  15 minutes    Vasopnuematic Location  Knee   Lt   Vasopneumatic Pressure Low    Vasopneumatic Temperature  34      Manual Therapy   Manual Therapy Joint mobilization;Soft tissue mobilization;Myofascial release;Passive ROM;Muscle Energy Technique;Edema management    Manual therapy comments skilled palpation and monitoring during DN    Edema Management retrograde massage from proximal shin/calf over knee to mid thigh    Joint Mobilization L patellar mobs all direction - emphasis on sup/inf to facilitate flexion/extension ROM; grade III-IV A/P femorotibial mobs while pt performing active quad set to faciliate increased extension ROM    Soft tissue mobilization STM/DTM to L quads & HS    Myofascial Release manual TPR and pin &  stretch to L quads & HS    Passive ROM manual overpressure into end range L knee flexion in mod thomas stretch & extension with leg supported on treatment table    Muscle Energy Technique contract/relax into L knee flexion in mod thomas stretch            Trigger Point Dry Needling - 03/13/20 1016    Consent Given? Yes    Muscles Treated Lower Quadrant Quadriceps;Rectus femoris;Vastus lateralis;Vastus intermedius;Hamstring   Lt   Electrical Stimulation Performed with Dry Needling Yes    E-stim with Dry Needling Details Lt VL & VI/RF, Lt medial & lateral HS    Quadriceps Response Twitch response elicited;Palpable increased muscle length    Rectus femoris Response Twitch response elicited;Palpable increased muscle length    Vastus lateralis Response Twitch response elicited;Palpable increased muscle length    Vastus intermedius Response Twitch response elicited;Palpable increased muscle length    Hamstring Response Twitch response elicited;Palpable increased muscle length                  PT Short Term Goals - 02/17/20 9147      PT SHORT TERM GOAL #1   Title Patient will be independent with initial HEP    Status Achieved   02/10/20     PT SHORT TERM GOAL #2   Title Patient will demonstrate L knee AROM >/= 5-90 dg to allow for improved gait pattern    Status Achieved   02/17/20 - L knee AROM 5-98            PT Long Term Goals - 03/09/20 0941      PT LONG TERM GOAL #1   Title Patient will be independent with ongoing/advanced HEP for self-management at home    Status Partially Met   03/09/20: met for current HEP   Target Date 04/06/20      PT LONG TERM GOAL #2   Title Patient will demonstrate L knee AROM >/= 2-115 dg to allow for normal gait and stair mechanics    Status On-going   03/09/20: AROM 4-106 after joint mobs   Target Date 04/06/20      PT LONG TERM GOAL #3   Title Patient will demonstrate improved L LE strength to >/= 4+/5 for improved stability and ease of  mobility    Status Partially Met   03/09/20: met except L hip adduction 4/5   Target Date 04/06/20      PT LONG TERM GOAL #4  Title Patient will ambulate with normal gait pattern w/o AD    Status Partially Met   03/09/20: pt continues to demonstrate intermittent limp   Target Date 04/06/20      PT LONG TERM GOAL #5   Title Patient will negotiate stairs reciprocally with normal step pattern w/o limitation due to L knee pain or weakness    Status Partially Met   03/09/20: able to ascend/descend reciprocally but with increased effort and some hip substitution   Target Date 04/06/20                 Plan - 03/13/20 1104    Clinical Impression Statement MD approving continued "aggressive PT" to facilitate edema reduction and promote increased L knee flexion & extension ROM. Tru continues to feel restricted due to overall muscle tightness in L LE, therefore today's session focusing on manual techniques including DN, STM/MFR, joint mobs, MET and retrograde massage to address joint and muscle restrictions as well as ongoing edema. Improving ROM noted in both directions by end of session with pt noting decreased feeling of tightness.    Comorbidities Multi-joint OA, L THA 2016, L shoulder scope, lumbar DDD    Rehab Potential Good    PT Frequency 2x / week    PT Duration 4 weeks    PT Treatment/Interventions ADLs/Self Care Home Management;Cryotherapy;Electrical Stimulation;Iontophoresis 29m/ml Dexamethasone;Moist Heat;DME Instruction;Gait training;Stair training;Functional mobility training;Therapeutic activities;Therapeutic exercise;Balance training;Neuromuscular re-education;Patient/family education;Manual techniques;Passive range of motion;Dry needling;Taping;Vasopneumatic Device;Joint Manipulations    PT Next Visit Plan progress LE strengthening with emphasis on quad control with TKE; L knee ROM; manual therapy including joint mobs & potential further DN for knee ROM, MT/STM for edema reduction  with possible trial of kinesiotaping for edema; gait training to further normalize gait pattern    PT Home Exercise Plan HH PT HEP: standing heel raise, squat, march, seated hip abduction, LAQ, supine SAQ, heel slide, quad set; MedBridge Access Codes: [email or web address] (02-05-2022; [email or web address] (2/8); [id number](2/11); [email or web address] (2/18)    Consulted and Agree with Plan of Care Patient           Patient will benefit from skilled therapeutic intervention in order to improve the following deficits and impairments:  Abnormal gait,Decreased activity tolerance,Decreased balance,Decreased endurance,Decreased knowledge of precautions,Decreased knowledge of use of DME,Decreased mobility,Decreased range of motion,Decreased safety awareness,Decreased scar mobility,Decreased strength,Difficulty walking,Increased edema,Increased fascial restricitons,Increased muscle spasms,Impaired perceived functional ability,Impaired flexibility,Improper body mechanics,Postural dysfunction,Pain  Visit Diagnosis: Stiffness of left knee, not elsewhere classified  Acute pain of left knee  Other abnormalities of gait and mobility  Difficulty in walking, not elsewhere classified  Other symptoms and signs involving the musculoskeletal system  Localized edema     Problem List Patient Active Problem List   Diagnosis Date Noted  . Status post total left knee replacement 01/03/2020  . Unilateral primary osteoarthritis, left knee 03/16/2019  . Osteoarthritis of left hip 07/07/2014  . Status post total replacement of left hip 07/07/2014    JPercival Spanish PT, MPT 03/13/2020, 12:37 PM  [facility] [place] [address]Phone: [phone]  Fax:  [phone] Name: HEVERTTE SONESMRN: [id number] of Birth: 507-15-1962

## 2020-03-13 NOTE — Progress Notes (Signed)
The patient is now 2 and half months status post a left total knee arthroplasty.  He is still struggling with his mobility.  He is having significant swelling with his knee still on the left side.  On exam he lacks full extension by 3 to 5 degrees and he flexes to about 100 degrees.  The knee is swollen but not red and it does feel stable.  He needs continued aggressive physical therapy on his left knee and needs the swelling to come down more before I am comfortable allowing him to return to work.  He works [employer] and I do not feel comfortable with letting him stand for long periods of time or walk long distances or perform any type of manual labor as of yet due to issues with his left knee.  I will give him a note reflecting that we would like to keep him out of work completely for the next 4 weeks as he continues to aggressively rehabilitate his knee.  We will reevaluate in 4 weeks on return to work status.  At the next visit I would like an AP and lateral of his left operative knee.

## 2020-03-16 ENCOUNTER — Encounter: Payer: Self-pay | Admitting: Physical Therapy

## 2020-03-16 ENCOUNTER — Other Ambulatory Visit: Payer: Self-pay

## 2020-03-16 ENCOUNTER — Ambulatory Visit: Payer: BC Managed Care – PPO | Admitting: Physical Therapy

## 2020-03-16 DIAGNOSIS — M25662 Stiffness of left knee, not elsewhere classified: Secondary | ICD-10-CM | POA: Diagnosis not present

## 2020-03-16 DIAGNOSIS — R2689 Other abnormalities of gait and mobility: Secondary | ICD-10-CM

## 2020-03-16 DIAGNOSIS — R600 Localized edema: Secondary | ICD-10-CM

## 2020-03-16 DIAGNOSIS — M25562 Pain in left knee: Secondary | ICD-10-CM

## 2020-03-16 DIAGNOSIS — R262 Difficulty in walking, not elsewhere classified: Secondary | ICD-10-CM

## 2020-03-16 DIAGNOSIS — R29898 Other symptoms and signs involving the musculoskeletal system: Secondary | ICD-10-CM

## 2020-03-16 NOTE — Therapy (Signed)
[facility] [address]  [place], [place], [address] Phone: [phone]   Fax:  [phone]  Physical Therapy Treatment  Patient Details  Name: Philip [name] MRN: [id number] Date of Birth: June 15, 1960 Referring Provider (PT): Mcarthur Rossetti, MD   Encounter Date: 03/16/2020   PT End of Session - 03/16/20 0918    Visit Number 17    Number of Visits 23    Date for PT Re-Evaluation 04/06/20    Authorization Type BCBS - VL=60 (PT/OT/ST combined)    PT Start Time 0918    PT Stop Time 1015    PT Time Calculation (min) 57 min    Activity Tolerance Patient tolerated treatment well    Behavior During Therapy [facility] for tasks assessed/performed           Past Medical History:  Diagnosis Date  . Arthritis   . Bulging lumbar disc   . Chest pain on exertion    past 2-3 weeks ---- 12/28/19-patient stated he pulled muscle in chest a long time ago but no recent "chest pain"  . Chronic diarrhea    several years    Past Surgical History:  Procedure Laterality Date  . fractured arm     as child  . SHOULDER ARTHROSCOPY    . TOTAL HIP ARTHROPLASTY Left 07/07/2014   Procedure: LEFT TOTAL HIP ARTHROPLASTY ANTERIOR APPROACH;  Surgeon: Mcarthur Rossetti, MD;  Location: [facility];  Service: Orthopedics;  Laterality: Left;  . TOTAL KNEE ARTHROPLASTY Left 01/03/2020   Procedure: LEFT TOTAL KNEE ARTHROPLASTY;  Surgeon: Mcarthur Rossetti, MD;  Location: [facility];  Service: Orthopedics;  Laterality: Left;  needs RNFA    There were no vitals filed for this visit.   Subjective Assessment - 03/16/20 0922    Subjective Pt reports his leg felt great after the DN last session - still notes some tightness but less than before.    Pertinent History L TKA 01/03/20, L THA, L shoulder scope    Patient Stated Goals "no pain - walk comfortable w/o limp & be normal"    Currently in Pain? Yes    Pain Score 3     Pain Location Knee     Pain Orientation Left    Pain Descriptors / Indicators Tightness    Pain Type Acute pain;Surgical pain    Pain Frequency Intermittent              [facility] PT Assessment - 03/16/20 0918      AROM   Left Knee Extension 4   12 in LAQ   Left Knee Flexion 110                         [facility] Adult PT Treatment/Exercise - 03/16/20 0918      Ambulation/Gait   Ambulation/Gait Assistance 7: Independent    Ambulation Distance (Feet) 300 Feet    Gait Pattern Step-through pattern;Decreased arm swing - right;Decreased arm swing - left;Lateral trunk lean to left;Trunk flexed      Exercises   Exercises Knee/Hip      Lumbar Exercises: Stretches   Lower Trunk Rotation Limitations 5 x 10"    Standing Side Bend Right;Left;2 reps;20 seconds    Standing Side Bend Limitations QL & ITB stretches at wall    Other Lumbar Stretch Exercise L/R open book stretches 10 x 5"      Knee/Hip Exercises: Aerobic   Recumbent Bike  L3 x 6 min      Vasopneumatic   Number Minutes Vasopneumatic  10 minutes    Vasopnuematic Location  Knee   Lt   Vasopneumatic Pressure High    Vasopneumatic Temperature  34      Manual Therapy   Manual Therapy Joint mobilization;Passive ROM    Joint Mobilization L patellar mobs all direction - emphasis on sup/inf to facilitate flexion/extension ROM; grade III-IV A/P femorotibial mobs with gentle distraciton to faciliate increased extension ROM; grade III-IV A/P tibiofemoral mobs in hooklying to promote increased flexion ROM    Passive ROM manual overpressure into end range L knee flexion in hooklying & extension with heel supported on 6" FR                  PT Education - 03/16/20 1014    Education Details HEP update - Access Code: [email or web address]    Person(s) Educated Patient    Methods Explanation;Demonstration;Verbal cues;Handout    Comprehension Verbalized understanding;Verbal cues required;Returned demonstration;Need further instruction             PT Short Term Goals - 02/17/20 0928      PT SHORT TERM GOAL #1   Title Patient will be independent with initial HEP    Status Achieved   02/10/20     PT SHORT TERM GOAL #2   Title Patient will demonstrate L knee AROM >/= 5-90 dg to allow for improved gait pattern    Status Achieved   02/17/20 - L knee AROM 5-98            PT Long Term Goals - 03/09/20 0941      PT LONG TERM GOAL #1   Title Patient will be independent with ongoing/advanced HEP for self-management at home    Status Partially Met   03/09/20: met for current HEP   Target Date 04/06/20      PT LONG TERM GOAL #2   Title Patient will demonstrate L knee AROM >/= 2-115 dg to allow for normal gait and stair mechanics    Status On-going   03/09/20: AROM 4-106 after joint mobs   Target Date 04/06/20      PT LONG TERM GOAL #3   Title Patient will demonstrate improved L LE strength to >/= 4+/5 for improved stability and ease of mobility    Status Partially Met   03/09/20: met except L hip adduction 4/5   Target Date 04/06/20      PT LONG TERM GOAL #4   Title Patient will ambulate with normal gait pattern w/o AD    Status Partially Met   03/09/20: pt continues to demonstrate intermittent limp   Target Date 04/06/20      PT LONG TERM GOAL #5   Title Patient will negotiate stairs reciprocally with normal step pattern w/o limitation due to L knee pain or weakness    Status Partially Met   03/09/20: able to ascend/descend reciprocally but with increased effort and some hip substitution   Target Date 04/06/20                 Plan - 03/16/20 0923    Clinical Impression Statement Philip [name] reports benefit from DN with reduction in tightness in his L leg following last session. He notes he still has to think about how he walks and feels like he starts to limp if he tries to walk faster. Analysis of his gait pattern reveals decreased reciprocal arm swing and L forward lateral  trunk lean. Trunk position related to chronic issues with  his low back but improved after lateral flexion and rotation stretches with pt noting greatest benefit from open book stretches, therefore instructions provide for HEP. Improving muscle tension from DN allowing for better patellar mobility and knee ROM with further gains noted following MT today - current AROM 4-110 with 12 of extension lacking in LAQ. Session concluded with [employer] vasopnuematic compression to knee as mild pitting edema still evident in L lower leg and knee.    Comorbidities Multi-joint OA, L THA 2016, L shoulder scope, lumbar DDD    Rehab Potential Good    PT Frequency 2x / week    PT Duration 4 weeks    PT Treatment/Interventions ADLs/Self Care Home Management;Cryotherapy;Electrical Stimulation;Iontophoresis 65m/ml Dexamethasone;Moist Heat;DME Instruction;Gait training;Stair training;Functional mobility training;Therapeutic activities;Therapeutic exercise;Balance training;Neuromuscular re-education;Patient/family education;Manual techniques;Passive range of motion;Dry needling;Taping;Vasopneumatic Device;Joint Manipulations    PT Next Visit Plan progress LE strengthening with emphasis on quad control with TKE; L knee ROM; manual therapy including joint mobs & potential further DN for knee ROM, MT/STM for edema reduction with possible trial of kinesiotaping for edema; gait training to further normalize gait pattern    PT Home Exercise Plan HH PT HEP: standing heel raise, squat, march, seated hip abduction, LAQ, supine SAQ, heel slide, quad set; MedBridge Access Codes: [email or web address] (02-03-22; [email or web address] (2/8); [id number](2/11); [email or web address] (2/18)    Consulted and Agree with Plan of Care Patient           Patient will benefit from skilled therapeutic intervention in order to improve the following deficits and impairments:  Abnormal gait,Decreased activity tolerance,Decreased balance,Decreased endurance,Decreased knowledge of precautions,Decreased knowledge of use of DME,Decreased  mobility,Decreased range of motion,Decreased safety awareness,Decreased scar mobility,Decreased strength,Difficulty walking,Increased edema,Increased fascial restricitons,Increased muscle spasms,Impaired perceived functional ability,Impaired flexibility,Improper body mechanics,Postural dysfunction,Pain  Visit Diagnosis: Stiffness of left knee, not elsewhere classified  Acute pain of left knee  Other abnormalities of gait and mobility  Difficulty in walking, not elsewhere classified  Other symptoms and signs involving the musculoskeletal system  Localized edema     Problem List Patient Active Problem List   Diagnosis Date Noted  . Status post total left knee replacement 01/03/2020  . Unilateral primary osteoarthritis, left knee 03/16/2019  . Osteoarthritis of left hip 07/07/2014  . Status post total replacement of left hip 07/07/2014    JPercival Spanish PT, MPT 03/16/2020, 11:51 AM  [facility] [place] [address]Phone: [phone]  Fax:  [phone] Name: Philip CUPPSMRN: [id number] of Birth: 503-22-1962

## 2020-03-16 NOTE — Patient Instructions (Signed)
   Access Code: [email or web address] URL: [email or web address] Date: 03/16/2020 Prepared by: Glenetta Hew  Exercises Sidelying Thoracic and Shoulder Rotation - 1 x daily - 7 x weekly - 10 reps - 10 sec hold Side Lying Open Book - 1 x daily - 7 x weekly - 10 reps - 10 sec hold

## 2020-03-20 ENCOUNTER — Other Ambulatory Visit: Payer: Self-pay

## 2020-03-20 ENCOUNTER — Ambulatory Visit: Payer: BC Managed Care – PPO | Admitting: Physical Therapy

## 2020-03-20 ENCOUNTER — Encounter: Payer: Self-pay | Admitting: Physical Therapy

## 2020-03-20 DIAGNOSIS — M25562 Pain in left knee: Secondary | ICD-10-CM

## 2020-03-20 DIAGNOSIS — R2689 Other abnormalities of gait and mobility: Secondary | ICD-10-CM

## 2020-03-20 DIAGNOSIS — R29898 Other symptoms and signs involving the musculoskeletal system: Secondary | ICD-10-CM

## 2020-03-20 DIAGNOSIS — M25662 Stiffness of left knee, not elsewhere classified: Secondary | ICD-10-CM | POA: Diagnosis not present

## 2020-03-20 DIAGNOSIS — R262 Difficulty in walking, not elsewhere classified: Secondary | ICD-10-CM

## 2020-03-20 DIAGNOSIS — R600 Localized edema: Secondary | ICD-10-CM

## 2020-03-20 NOTE — Therapy (Signed)
Philip [name] [address]  [place] [name], [place], [address] Phone: [phone]   Fax:  [phone]  Physical Therapy Treatment  Patient Details  Name: Philip [name] MRN: [id number] Date of Birth: Jul 31, 1960 Referring Philip [name] (PT): Philip Rossetti, MD   Encounter Date: 03/20/2020   PT End of Session - 03/20/20 1018    Visit Number 18    Number of Visits 23    Date for PT Re-Evaluation 04/06/20    Authorization Type BCBS - VL=60 (PT/OT/ST combined)    PT Start Time 1018    PT Stop Time 1112    PT Time Calculation (min) 54 min    Activity Tolerance Patient tolerated treatment well    Behavior During Therapy [facility] for tasks assessed/performed           Past Medical History:  Diagnosis Date  . Arthritis   . Bulging lumbar disc   . Chest pain on exertion    past 2-3 weeks ---- 12/28/19-patient stated he pulled muscle in chest a long time ago but no recent "chest pain"  . Chronic diarrhea    several years    Past Surgical History:  Procedure Laterality Date  . fractured arm     as child  . SHOULDER ARTHROSCOPY    . TOTAL HIP ARTHROPLASTY Left 07/07/2014   Procedure: LEFT TOTAL HIP ARTHROPLASTY ANTERIOR APPROACH;  Surgeon: [place] Rossetti, MD;  Location: [facility];  Service: Orthopedics;  Laterality: Left;  . TOTAL KNEE ARTHROPLASTY Left 01/03/2020   Procedure: LEFT TOTAL KNEE ARTHROPLASTY;  Surgeon: [place] Rossetti, MD;  Location: [place];  Service: Orthopedics;  Laterality: Left;  needs RNFA    There were no vitals filed for this visit.   Subjective Assessment - 03/20/20 1022    Subjective Pt. reports stiffness and pain in L knee when moving from sitting to standing and when walking. Reports the thoracic rotation stretch eases stiffness for improved reciprocal motion when walking.    Pertinent History L TKA 01/03/20, L THA, L shoulder scope    Limitations Sitting;Standing    Patient Stated Goals  "no pain - walk comfortable w/o limp & be normal"    Currently in Pain? Yes    Pain Score 3     Pain Location Knee    Pain Orientation Left    Pain Descriptors / Indicators Tightness   stiffness   Pain Type Acute pain;Surgical pain                             [facility] Adult PT Treatment/Exercise - 03/20/20 1018      Exercises   Exercises Knee/Hip      Knee/Hip Exercises: Aerobic   Nustep L5 x 6 min      Knee/Hip Exercises: Machines for Strengthening   Cybex Knee Extension B con/L ecc 20# x 12   machine set to help push into extension stretch at end of eccentric motion   Cybex Knee Flexion B con/L ecc 20# x 12   machine set to help push into extension stretch at end of eccentric motion   Cybex Leg Press 20# B LE x 12      Knee/Hip Exercises: Standing   Terminal Knee Extension Left;15 reps;Theraband;Strengthening    Theraband Level (Terminal Knee Extension) --   black   Forward Step Up Left;15 reps;Step Height: 6";Hand Hold: 1    Forward Step Up Limitations +  black TB TKE to increase quad control; hand on corner of counter for balance    Functional Squat 15 reps;3 seconds    Functional Squat Limitations TRX - good even wt shift    Other Standing Knee Exercises R/L controlled step-over with fwd eccentric heel tap 2 x 5      Vasopneumatic   Number Minutes Vasopneumatic  10 minutes    Vasopnuematic Location  Knee   Lt   Vasopneumatic Pressure High    Vasopneumatic Temperature  34                    PT Short Term Goals - 02/17/20 0928      PT SHORT TERM GOAL #1   Title Patient will be independent with initial HEP    Status Achieved   02/10/20     PT SHORT TERM GOAL #2   Title Patient will demonstrate L knee AROM >/= 5-90 dg to allow for improved gait pattern    Status Achieved   02/17/20 - L knee AROM 5-98            PT Long Term Goals - 03/09/20 0941      PT LONG TERM GOAL #1   Title Patient will be independent with ongoing/advanced HEP for  self-management at home    Status Partially Met   03/09/20: met for current HEP   Target Date 04/06/20      PT LONG TERM GOAL #2   Title Patient will demonstrate L knee AROM >/= 2-115 dg to allow for normal gait and stair mechanics    Status On-going   03/09/20: AROM 4-106 after joint mobs   Target Date 04/06/20      PT LONG TERM GOAL #3   Title Patient will demonstrate improved L LE strength to >/= 4+/5 for improved stability and ease of mobility    Status Partially Met   03/09/20: met except L hip adduction 4/5   Target Date 04/06/20      PT LONG TERM GOAL #4   Title Patient will ambulate with normal gait pattern w/o AD    Status Partially Met   03/09/20: pt continues to demonstrate intermittent limp   Target Date 04/06/20      PT LONG TERM GOAL #5   Title Patient will negotiate stairs reciprocally with normal step pattern w/o limitation due to L knee pain or weakness    Status Partially Met   03/09/20: able to ascend/descend reciprocally but with increased effort and some hip substitution   Target Date 04/06/20                 Plan - 03/20/20 1112    Clinical Impression Statement Philip [name] notes improvement in trunk flexibility for walking following recent HEP update. Today we worked on functional exercise to improve knee flexion and extension. Cuing provided for leg press for terminal knee extension during concentric phase. Eccentric focus on knee flexion and extension machines to isolate muscle strengthening on the left as well as positioning of machine to allow stretch at end ROM. Reviewed proper squat technique with pt. able to demonstrate proper weight shifting. Patient able to demonstrate improved eccentric control with step over eccentric heel taps. Patient progressed TKE with black TheraBand with forward step-up with good control. Need to assess Knee ROM next visit.    Comorbidities Multi-joint OA, L THA 2016, L shoulder scope, lumbar DDD    Rehab Potential Good    PT Frequency 2x  /  week    PT Duration 4 weeks    PT Treatment/Interventions ADLs/Self Care Home Management;Cryotherapy;Electrical Stimulation;Iontophoresis 47m/ml Dexamethasone;Moist Heat;DME Instruction;Gait training;Stair training;Functional mobility training;Therapeutic activities;Therapeutic exercise;Balance training;Neuromuscular re-education;Patient/family education;Manual techniques;Passive range of motion;Dry needling;Taping;Vasopneumatic Device;Joint Manipulations    PT Next Visit Plan progress LE strengthening with emphasis on quad control with TKE; L knee ROM; manual therapy including joint mobs & potential further DN for knee ROM, MT/STM for edema reduction with possible trial of kinesiotaping for edema; gait training to further normalize gait pattern    PT Home Exercise Plan HH PT HEP: standing heel raise, squat, march, seated hip abduction, LAQ, supine SAQ, heel slide, quad set; MedBridge Access Codes: [id number] (02-06-2022; [email or web address] (2/8); [id number](2/11); KKFDLZHA (2/18);FDPYHJMF(3/11)    Consulted and Agree with Plan of Care Patient           Patient will benefit from skilled therapeutic intervention in order to improve the following deficits and impairments:  Abnormal gait,Decreased activity tolerance,Decreased balance,Decreased endurance,Decreased knowledge of precautions,Decreased knowledge of use of DME,Decreased mobility,Decreased range of motion,Decreased safety awareness,Decreased scar mobility,Decreased strength,Difficulty walking,Increased edema,Increased fascial restricitons,Increased muscle spasms,Impaired perceived functional ability,Impaired flexibility,Improper body mechanics,Postural dysfunction,Pain  Visit Diagnosis: Stiffness of left knee, not elsewhere classified  Acute pain of left knee  Other abnormalities of gait and mobility  Difficulty in walking, not elsewhere classified  Other symptoms and signs involving the musculoskeletal system  Localized edema     Problem  List Patient Active Problem List   Diagnosis Date Noted  . Status post total left knee replacement 01/03/2020  . Unilateral primary osteoarthritis, left knee 03/16/2019  . Osteoarthritis of left hip 07/07/2014  . Status post total replacement of left hip 07/07/2014    SNewman NickelsSPT 03/20/2020, 11:36 AM  [facility] [place] [address]Phone: [phone]  Fax:  [phone] Name: HJAQWAN WIEBERMRN: [id number] of Birth: 511/05/1960

## 2020-03-23 ENCOUNTER — Ambulatory Visit: Payer: BC Managed Care – PPO | Admitting: Physical Therapy

## 2020-03-23 ENCOUNTER — Other Ambulatory Visit: Payer: Self-pay

## 2020-03-23 ENCOUNTER — Encounter: Payer: Self-pay | Admitting: Physical Therapy

## 2020-03-23 DIAGNOSIS — M25662 Stiffness of left knee, not elsewhere classified: Secondary | ICD-10-CM | POA: Diagnosis not present

## 2020-03-23 DIAGNOSIS — R29898 Other symptoms and signs involving the musculoskeletal system: Secondary | ICD-10-CM

## 2020-03-23 DIAGNOSIS — M25562 Pain in left knee: Secondary | ICD-10-CM

## 2020-03-23 DIAGNOSIS — R2689 Other abnormalities of gait and mobility: Secondary | ICD-10-CM

## 2020-03-23 DIAGNOSIS — R600 Localized edema: Secondary | ICD-10-CM

## 2020-03-23 DIAGNOSIS — R262 Difficulty in walking, not elsewhere classified: Secondary | ICD-10-CM

## 2020-03-23 NOTE — Therapy (Signed)
[facility] [address]  [place], [place], [address] Phone: [phone]   Fax:  [phone]  Physical Therapy Treatment  Patient Details  Name: Philip [name] MRN: [id number] Date of Birth: Apr 09, 1960 Referring Provider (PT): Mcarthur Rossetti, MD   Encounter Date: 03/23/2020   PT End of Session - 03/23/20 0921    Visit Number 19    Number of Visits 23    Date for PT Re-Evaluation 04/06/20    Authorization Type BCBS - VL=60 (PT/OT/ST combined)    PT Start Time 0921    PT Stop Time 1028    PT Time Calculation (min) 67 min    Activity Tolerance Patient tolerated treatment well    Behavior During Therapy [facility] for tasks assessed/performed           Past Medical History:  Diagnosis Date  . Arthritis   . Bulging lumbar disc   . Chest pain on exertion    past 2-3 weeks ---- 12/28/19-patient stated he pulled muscle in chest a long time ago but no recent "chest pain"  . Chronic diarrhea    several years    Past Surgical History:  Procedure Laterality Date  . fractured arm     as child  . SHOULDER ARTHROSCOPY    . TOTAL HIP ARTHROPLASTY Left 07/07/2014   Procedure: LEFT TOTAL HIP ARTHROPLASTY ANTERIOR APPROACH;  Surgeon: Mcarthur Rossetti, MD;  Location: [facility];  Service: Orthopedics;  Laterality: Left;  . TOTAL KNEE ARTHROPLASTY Left 01/03/2020   Procedure: LEFT TOTAL KNEE ARTHROPLASTY;  Surgeon: Mcarthur Rossetti, MD;  Location: [place];  Service: Orthopedics;  Laterality: Left;  needs RNFA    There were no vitals filed for this visit.   Subjective Assessment - 03/23/20 0922    Subjective Pt. reports L knee stiffness in the morning and with prolonged sitting. Stiffness is relieved with movement, such as walking. He reports pain is improving along with function for stairs with use of handrail.    Pertinent History L TKA 01/03/20, L THA, L shoulder scope    Limitations Sitting;Standing    Patient  Stated Goals "no pain - walk comfortable w/o limp & be normal"    Currently in Pain? Yes    Pain Score 3     Pain Location Knee    Pain Orientation Right    Pain Descriptors / Indicators Tightness    Pain Type Acute pain;Surgical pain    Aggravating Factors  Sitting too long    Pain Relieving Factors Walking    Effect of Pain on Daily Activities Getting better with functional activity; feels good with walking up and down steps along with hand rail              [facility] PT Assessment - 03/23/20 0921      AROM   Left Knee Extension 8   15 in LAQ   Left Knee Flexion 108                         [facility] Adult PT Treatment/Exercise - 03/23/20 0921      Ambulation/Gait   Stairs Yes    Stairs Assistance 6: Modified independent (Device/Increase time)    Stairs Assistance Details (indicate cue type and reason) --   Cuing provided on keeping hips level for stair descent.   Stair Management Technique One rail Right;Alternating pattern;Forwards    Number of Stairs 14  Height of Stairs 7      Exercises   Exercises Knee/Hip      Knee/Hip Exercises: Aerobic   Recumbent Bike L5 x 6 min    Nustep --      Knee/Hip Exercises: Machines for Strengthening   Cybex Knee Extension B con/L ecc 20# x 12   machine set to help push into extension stretch at end of eccentric motion   Cybex Leg Press --      Knee/Hip Exercises: Standing   Forward Step Up Left;2 sets;10 reps;Hand Hold: 1;Step Height: 6"    Forward Step Up Limitations + black TB TKE to increase quad control; hand on window counter for balance    Other Standing Knee Exercises R/L controlled step down with forward eccentric heel tap 1x10, 4 inch step, Hand hold:1; 1x10 6 inch step, Hand hold:1    Other Standing Knee Exercises R/L controlled step down with lateral eccentric heel tap 1x10, 4 in step, Hand Hold: 2      Modalities   Modalities Vasopneumatic      Vasopneumatic   Number Minutes Vasopneumatic  10 minutes     Vasopnuematic Location  Knee    Vasopneumatic Pressure High    Vasopneumatic Temperature  34      Manual Therapy   Manual Therapy Joint mobilization    Joint Mobilization L A/P Femrotibial Glide Grade III and IV to facilitate EXT; L A/P Tibiofemoral glide grades III and IV in hooklying and Distraction with Flexion in sitting to facilitate FLEX                    PT Short Term Goals - 02/17/20 7035      PT SHORT TERM GOAL #1   Title Patient will be independent with initial HEP    Status Achieved   02/10/20     PT SHORT TERM GOAL #2   Title Patient will demonstrate L knee AROM >/= 5-90 dg to allow for improved gait pattern    Status Achieved   02/17/20 - L knee AROM 5-98            PT Long Term Goals - 03/09/20 0941      PT LONG TERM GOAL #1   Title Patient will be independent with ongoing/advanced HEP for self-management at home    Status Partially Met   03/09/20: met for current HEP   Target Date 04/06/20      PT LONG TERM GOAL #2   Title Patient will demonstrate L knee AROM >/= 2-115 dg to allow for normal gait and stair mechanics    Status On-going   03/09/20: AROM 4-106 after joint mobs   Target Date 04/06/20      PT LONG TERM GOAL #3   Title Patient will demonstrate improved L LE strength to >/= 4+/5 for improved stability and ease of mobility    Status Partially Met   03/09/20: met except L hip adduction 4/5   Target Date 04/06/20      PT LONG TERM GOAL #4   Title Patient will ambulate with normal gait pattern w/o AD    Status Partially Met   03/09/20: pt continues to demonstrate intermittent limp   Target Date 04/06/20      PT LONG TERM GOAL #5   Title Patient will negotiate stairs reciprocally with normal step pattern w/o limitation due to L knee pain or weakness    Status Partially Met   03/09/20: able to ascend/descend reciprocally  but with increased effort and some hip substitution   Target Date 04/06/20                 Plan - 03/23/20 1053     Clinical Impression Statement Philip [name] reports an increase in functional movement following recent treatment and following HEP progression. We assessed stair navigation and provided cuing for hip alignment on stair descent. Education was provided on this compensation of hip drop and Philip [name] demonstrated better gait mechanics on the following set. Pt. L knee ROM has not progressed from previous assessment but increased slightly intrasession following interventions. Philip [name] will continue to benfit from skilled PT to decrease L knee stiffness and increase ROM for improved gait and stair navigation.    Comorbidities Multi-joint OA, L THA 2016, L shoulder scope, lumbar DDD    Rehab Potential Good    PT Frequency 2x / week    PT Duration 4 weeks    PT Treatment/Interventions ADLs/Self Care Home Management;Cryotherapy;Electrical Stimulation;Iontophoresis 22m/ml Dexamethasone;Moist Heat;DME Instruction;Gait training;Stair training;Functional mobility training;Therapeutic activities;Therapeutic exercise;Balance training;Neuromuscular re-education;Patient/family education;Manual techniques;Passive range of motion;Dry needling;Taping;Vasopneumatic Device;Joint Manipulations    PT Next Visit Plan MD Progress Note due on 3/23; Assess AROM and PROM; reassess LTG's; Assess muscle tightness    PT Home Exercise Plan HH PT HEP: standing heel raise, squat, march, seated hip abduction, LAQ, supine SAQ, heel slide, quad set; MedBridge Access Codes: [email or web address] (February 19, 2022; [email or web address] (2/8); [id number](2/11); KKFDLZHA (2/18);FDPYHJMF(3/11)    Consulted and Agree with Plan of Care Patient           Patient will benefit from skilled therapeutic intervention in order to improve the following deficits and impairments:  Decreased activity tolerance,Decreased balance,Decreased endurance,Decreased knowledge of precautions,Decreased knowledge of use of DME,Decreased mobility,Decreased range of motion,Decreased safety awareness,Decreased scar  mobility,Decreased strength,Difficulty walking,Increased edema,Increased fascial restricitons,Increased muscle spasms,Impaired perceived functional ability,Impaired flexibility,Improper body mechanics,Postural dysfunction,Pain  Visit Diagnosis: Stiffness of left knee, not elsewhere classified  Acute pain of left knee  Other abnormalities of gait and mobility  Difficulty in walking, not elsewhere classified  Other symptoms and signs involving the musculoskeletal system  Localized edema     Problem List Patient Active Problem List   Diagnosis Date Noted  . Status post total left knee replacement 01/03/2020  . Unilateral primary osteoarthritis, left knee 03/16/2019  . Osteoarthritis of left hip 07/07/2014  . Status post total replacement of left hip 07/07/2014    SNewman NickelsSPT 03/23/2020, 11:42 AM  [facility] [place] [address]Phone: [phone]  Fax:  [phone] Name: HARLESTER KEEHANMRN: [id number] of Birth: [birth year]

## 2020-03-27 ENCOUNTER — Ambulatory Visit: Payer: BC Managed Care – PPO | Admitting: Physical Therapy

## 2020-03-27 ENCOUNTER — Other Ambulatory Visit: Payer: Self-pay

## 2020-03-27 ENCOUNTER — Encounter: Payer: Self-pay | Admitting: Physical Therapy

## 2020-03-27 DIAGNOSIS — R262 Difficulty in walking, not elsewhere classified: Secondary | ICD-10-CM

## 2020-03-27 DIAGNOSIS — R600 Localized edema: Secondary | ICD-10-CM

## 2020-03-27 DIAGNOSIS — M25662 Stiffness of left knee, not elsewhere classified: Secondary | ICD-10-CM

## 2020-03-27 DIAGNOSIS — R2689 Other abnormalities of gait and mobility: Secondary | ICD-10-CM

## 2020-03-27 DIAGNOSIS — M25562 Pain in left knee: Secondary | ICD-10-CM

## 2020-03-27 DIAGNOSIS — R29898 Other symptoms and signs involving the musculoskeletal system: Secondary | ICD-10-CM

## 2020-03-27 NOTE — Therapy (Signed)
[facility] [address]  [place], [place], [address] Phone: [phone]   Fax:  [phone]  Physical Therapy Treatment / Progress Note  Patient Details  Name: Philip [name] MRN: [id number] Date of Birth: [birth year] Referring Provider (PT): Mcarthur Rossetti, MD  Progress Note  Reporting Period 03/09/20 to 03/27/20  See note below for Objective Data and Assessment of Progress/Goals.      Encounter Date: 03/27/2020   PT End of Session - 03/27/20 1116    Visit Number 20    Number of Visits 23    Date for PT Re-Evaluation 04/06/20    Authorization Type BCBS - VL=60 (PT/OT/ST combined)    PT Start Time 1021    PT Stop Time 1115    PT Time Calculation (min) 54 min    Activity Tolerance Patient tolerated treatment well;No increased pain    Behavior During Therapy WFL for tasks assessed/performed           Past Medical History:  Diagnosis Date  . Arthritis   . Bulging lumbar disc   . Chest pain on exertion    past 2-3 weeks ---- 12/28/19-patient stated he pulled muscle in chest a long time ago but no recent "chest pain"  . Chronic diarrhea    several years    Past Surgical History:  Procedure Laterality Date  . fractured arm     as child  . SHOULDER ARTHROSCOPY    . TOTAL HIP ARTHROPLASTY Left 07/07/2014   Procedure: LEFT TOTAL HIP ARTHROPLASTY ANTERIOR APPROACH;  Surgeon: Mcarthur Rossetti, MD;  Location: [facility];  Service: Orthopedics;  Laterality: Left;  . TOTAL KNEE ARTHROPLASTY Left 01/03/2020   Procedure: LEFT TOTAL KNEE ARTHROPLASTY;  Surgeon: Mcarthur Rossetti, MD;  Location: [facility];  Service: Orthopedics;  Laterality: Left;  needs RNFA    There were no vitals filed for this visit.   Subjective Assessment - 03/27/20 1023    Subjective Pt reports feeling better today following current HEP and previous treatment. Still has stiffness in the morning and following prolonged sitting.  Says he has to focus on his knee to make sure it bends while walking and thinks about keeping hips level when navigating stairs. Reports low back has loosened up following postural education.    Pertinent History L TKA 01/03/20, L THA, L shoulder scope    Limitations Sitting;Standing    Patient Stated Goals "no pain - walk comfortable w/o limp & be normal"    Currently in Pain? Yes    Pain Score 2     Pain Location Knee    Pain Orientation Left    Pain Descriptors / Indicators Tightness;Sore    Aggravating Factors  Sitting too long; has to find comfrtable sitting position;    Pain Relieving Factors Walking and moving decreases stiffness              [facility] PT Assessment - 03/27/20 1022      Assessment   Medical Diagnosis L TKA      AROM   Left Knee Extension 5    Left Knee Flexion 111   LAQ 13     Strength   Right Hip Flexion 5/5    Right Hip Extension 5/5    Right Hip ABduction 5/5    Right Hip ADduction 5/5    Left Hip Flexion 5/5    Left Hip Extension 5/5    Left Hip ABduction 5/5  Left Hip ADduction 5/5    Right Knee Flexion 5/5    Right Knee Extension 5/5    Left Knee Flexion 5/5    Left Knee Extension 5/5    Right Ankle Dorsiflexion 5/5    Left Ankle Dorsiflexion 5/5                         [facility] Adult PT Treatment/Exercise - 03/27/20 1021      Exercises   Exercises Knee/Hip      Knee/Hip Exercises: Stretches   Passive Hamstring Stretch Left;2 reps;30 seconds    Quad Stretch Left;1 rep;30 seconds    Hip Flexor Stretch Left;1 rep;30 seconds    Gastroc Stretch Left;2 reps;30 seconds      Knee/Hip Exercises: Aerobic   Recumbent Bike L5 x 6 min      Knee/Hip Exercises: Machines for Strengthening   Cybex Knee Extension B con/L ecc 20# 1 x 12   machine set to help push into flexion stretch at end of eccentric motion   Cybex Knee Flexion B con/L ecc 20# 1 x 12   machine set to help push into extension stretch at end of eccentric motion      Vasopneumatic   Number Minutes Vasopneumatic  10 minutes    Vasopnuematic Location  Knee    Vasopneumatic Pressure High    Vasopneumatic Temperature  34      Manual Therapy   Manual Therapy Joint mobilization    Joint Mobilization L A/P Femrotibial Glide Grade III and IV to facilitate EXT; L A/P Tibiofemoral glide grades III and IV in hooklying and Gapping with Flexion in sitting to facilitate FLEX                    PT Short Term Goals - 02/17/20 3419      PT SHORT TERM GOAL #1   Title Patient will be independent with initial HEP    Status Achieved   02/10/20     PT SHORT TERM GOAL #2   Title Patient will demonstrate L knee AROM >/= 5-90 dg to allow for improved gait pattern    Status Achieved   02/17/20 - L knee AROM 5-98            PT Long Term Goals - 03/27/20 1126      PT LONG TERM GOAL #1   Title Patient will be independent with ongoing/advanced HEP for self-management at home    Status Partially Met   03/09/20: met for current HEP     PT LONG TERM GOAL #2   Title Patient will demonstrate L knee AROM >/= 2-115 dg to allow for normal gait and stair mechanics   Ext 5 and Flex 111 (03/27/2020)   Status Partially Met      PT LONG TERM GOAL #3   Title Patient will demonstrate improved L LE strength to >/= 4+/5 for improved stability and ease of mobility    Status Achieved   03/27/2020     PT LONG TERM GOAL #4   Title Patient will ambulate with normal gait pattern w/o AD    Status Partially Met   03/27/20: pt continues to demonstrate intermittent limp     PT LONG TERM GOAL #5   Title Patient will negotiate stairs reciprocally with normal step pattern w/o limitation due to L knee pain or weakness    Status On-going   03/27/20: shows improvement in ablility to ascend/descend reciprocally  with increased concentration to reduce hip substitution                Plan - 03/27/20 1122    Clinical Impression Statement Philip [name] reports a decrease in pain and an  increase in LE awareness for knee flexion during walking. He is recognizing importance of muscle activation to improve gait mechanics. He is progressing towards LTG #2 with an increase in knee flexion to 111 degrees and extension to 5 degrees. He has achieved LTG #3 with improved L LE Strength at 5/5. Pt will continue to benefit from skilled PT to increase ROM, and ambulate and navigate stairs with a normal, reciprocal pattern.    Comorbidities Multi-joint OA, L THA 2016, L shoulder scope, lumbar DDD    Rehab Potential Good    PT Frequency 2x / week    PT Duration 4 weeks    PT Treatment/Interventions ADLs/Self Care Home Management;Cryotherapy;Electrical Stimulation;Iontophoresis 80m/ml Dexamethasone;Moist Heat;DME Instruction;Gait training;Stair training;Functional mobility training;Therapeutic activities;Therapeutic exercise;Balance training;Neuromuscular re-education;Patient/family education;Manual techniques;Passive range of motion;Dry needling;Taping;Vasopneumatic Device;Joint Manipulations    PT Next Visit Plan Discuss Dr.visit; Work on Knee ROM with joint mobilization or muscle stretching for flexion and extension; work to improve gAeronautical engineerand stair navigation    PT Home Exercise Plan HH PT HEP: standing heel raise, squat, march, seated hip abduction, LAQ, supine SAQ, heel slide, quad set; MedBridge Access Codes: [email or web address] (02-08-2022; [email or web address] (2/8); [id number](2/11); KKFDLZHA (2/18);FDPYHJMF(3/11)    Consulted and Agree with Plan of Care Patient           Patient will benefit from skilled therapeutic intervention in order to improve the following deficits and impairments:  Decreased activity tolerance,Decreased balance,Decreased endurance,Decreased knowledge of precautions,Decreased knowledge of use of DME,Decreased mobility,Decreased range of motion,Decreased safety awareness,Decreased scar mobility,Decreased strength,Difficulty walking,Increased edema,Increased fascial restricitons,Increased  muscle spasms,Impaired perceived functional ability,Impaired flexibility,Improper body mechanics,Postural dysfunction,Pain  Visit Diagnosis: Stiffness of left knee, not elsewhere classified  Acute pain of left knee  Other abnormalities of gait and mobility  Difficulty in walking, not elsewhere classified  Other symptoms and signs involving the musculoskeletal system  Localized edema     Problem List Patient Active Problem List   Diagnosis Date Noted  . Status post total left knee replacement 01/03/2020  . Unilateral primary osteoarthritis, left knee 03/16/2019  . Osteoarthritis of left hip 07/07/2014  . Status post total replacement of left hip 07/07/2014    SNewman NickelsSPT 03/27/2020, 1:10 PM  [facility] [place] [address]Phone: [phone]  Fax:  [phone] Name: HSTYLIANOS STRADLINGMRN: [id number] of Birth: [birth year]

## 2020-03-28 ENCOUNTER — Ambulatory Visit (INDEPENDENT_AMBULATORY_CARE_PROVIDER_SITE_OTHER): Payer: BC Managed Care – PPO | Admitting: Orthopaedic Surgery

## 2020-03-28 ENCOUNTER — Encounter: Payer: Self-pay | Admitting: Orthopaedic Surgery

## 2020-03-28 ENCOUNTER — Ambulatory Visit (INDEPENDENT_AMBULATORY_CARE_PROVIDER_SITE_OTHER): Payer: BC Managed Care – PPO

## 2020-03-28 ENCOUNTER — Telehealth: Payer: Self-pay

## 2020-03-28 DIAGNOSIS — Z96652 Presence of left artificial knee joint: Secondary | ICD-10-CM

## 2020-03-28 NOTE — Telephone Encounter (Signed)
That is fine 

## 2020-03-28 NOTE — Progress Notes (Signed)
Office Visit Note   Patient: Philip [name]           Date of Birth: 30-Apr-1960           MRN: [id number] Visit Date: 03/28/2020              Requested by: Practice, [facility] [address] [place],  [place] [address] PCP: Practice, [facility]   Assessment & Plan: Visit Diagnoses:  1. Status post total left knee replacement     Plan: We will have him return to work next Monday half days for 2 weeks.  He will need to have periodic breaks for icing rest of the knee over the next 4 weeks.  We will see him back in 3 months for reevaluation of the left knee.  Continue work on range of motion strengthening the knee.  Questions were encouraged and answered.  Follow-Up Instructions: Return in about 3 months (around 06/28/2020).   Orders:  Orders Placed This Encounter  Procedures  . XR Knee 1-2 Views Left   No orders of the defined types were placed in this encounter.     Procedures: No procedures performed   Clinical Data: No additional findings.   Subjective: Chief Complaint  Patient presents with  . Left Knee - Routine Post Op    HPI [name] returns today almost 3 months status post left total knee arthroplasty.  He is overall doing well.  He states that still stiff especially in the morning.  But he feels that his range of motion and strength are improving.  He is wanting to return back to work on April 02, 2020.   Review of Systems   Objective: Vital Signs: There were no vitals taken for this visit.  Physical Exam Constitutional:      Appearance: He is not ill-appearing or diaphoretic.  Pulmonary:     Effort: Pulmonary effort is normal.  Neurological:     Mental Status: He is alert and oriented to person, place, and time.  Psychiatric:        Mood and Affect: Mood normal.     Ortho Exam Left knee near full extension.  Flexion to 105 degrees passively.  Actively flexes to around 100 degrees.  Surgical incisions  healing well.  Calf supple nontender.  No instability valgus varus stressing.  Specialty Comments:  No specialty comments available.  Imaging: XR Knee 1-2 Views Left  Result Date: 03/28/2020 Left knee: 2 views: No acute fracture.  Total knee arthroplasty components are well-seated.  Knee is well located.    PMFS History: Patient Active Problem List   Diagnosis Date Noted  . Status post total left knee replacement 01/03/2020  . Unilateral primary osteoarthritis, left knee 03/16/2019  . Osteoarthritis of left hip 07/07/2014  . Status post total replacement of left hip 07/07/2014   Past Medical History:  Diagnosis Date  . Arthritis   . Bulging lumbar disc   . Chest pain on exertion    past 2-3 weeks ---- 12/28/19-patient stated he pulled muscle in chest a long time ago but no recent "chest pain"  . Chronic diarrhea    several years    History reviewed. No pertinent family history.  Past Surgical History:  Procedure Laterality Date  . fractured arm     as child  . SHOULDER ARTHROSCOPY    . TOTAL HIP ARTHROPLASTY Left 07/07/2014   Procedure: LEFT TOTAL HIP ARTHROPLASTY ANTERIOR APPROACH;  Surgeon: Cristal Deer  Aretha Parrot, MD;  Location: [facility];  Service: Orthopedics;  Laterality: Left;  . TOTAL KNEE ARTHROPLASTY Left 01/03/2020   Procedure: LEFT TOTAL KNEE ARTHROPLASTY;  Surgeon: Kathryne Hitch, MD;  Location: [facility];  Service: Orthopedics;  Laterality: Left;  needs RNFA   Social History   Occupational History  . Not on file  Tobacco Use  . Smoking status: Never Smoker  . Smokeless tobacco: Never Used  Vaping Use  . Vaping Use: Never used  Substance and Sexual Activity  . Alcohol use: No  . Drug use: No  . Sexual activity: Not on file

## 2020-03-28 NOTE — Telephone Encounter (Signed)
Note completed and placed at the front desk. Pt was informed

## 2020-03-28 NOTE — Telephone Encounter (Signed)
Ok to change note 

## 2020-03-28 NOTE — Telephone Encounter (Signed)
Pt called regarding his note to return to work.  He would like to change some information on the note.    Pt would like instead of half a day he would like light duty for 8 hours a day for 2 weeks 5 days a week.

## 2020-03-30 ENCOUNTER — Ambulatory Visit: Payer: BC Managed Care – PPO | Admitting: Physical Therapy

## 2020-03-30 ENCOUNTER — Encounter: Payer: Self-pay | Admitting: Physical Therapy

## 2020-03-30 ENCOUNTER — Other Ambulatory Visit: Payer: Self-pay

## 2020-03-30 DIAGNOSIS — M25562 Pain in left knee: Secondary | ICD-10-CM

## 2020-03-30 DIAGNOSIS — M25662 Stiffness of left knee, not elsewhere classified: Secondary | ICD-10-CM

## 2020-03-30 DIAGNOSIS — R600 Localized edema: Secondary | ICD-10-CM

## 2020-03-30 DIAGNOSIS — R262 Difficulty in walking, not elsewhere classified: Secondary | ICD-10-CM

## 2020-03-30 DIAGNOSIS — R29898 Other symptoms and signs involving the musculoskeletal system: Secondary | ICD-10-CM

## 2020-03-30 DIAGNOSIS — R2689 Other abnormalities of gait and mobility: Secondary | ICD-10-CM

## 2020-03-30 NOTE — Therapy (Signed)
[facility] [address]  [place], [place], [address] Phone: [phone]   Fax:  [phone]  Physical Therapy Treatment  Patient Details  Name: Philip [name] MRN: [id number] Date of Birth: 1960/04/18 Referring Provider (PT): Mcarthur Rossetti, MD   Encounter Date: 03/30/2020   PT End of Session - 03/30/20 0932    Visit Number 21    Number of Visits 23    Date for PT Re-Evaluation 04/06/20    Authorization Type BCBS - VL=60 (PT/OT/ST combined)    PT Start Time 0932    PT Stop Time 1029    PT Time Calculation (min) 57 min    Activity Tolerance Patient tolerated treatment well;No increased pain    Behavior During Therapy WFL for tasks assessed/performed           Past Medical History:  Diagnosis Date  . Arthritis   . Bulging lumbar disc   . Chest pain on exertion    past 2-3 weeks ---- 12/28/19-patient stated he pulled muscle in chest a long time ago but no recent "chest pain"  . Chronic diarrhea    several years    Past Surgical History:  Procedure Laterality Date  . fractured arm     as child  . SHOULDER ARTHROSCOPY    . TOTAL HIP ARTHROPLASTY Left 07/07/2014   Procedure: LEFT TOTAL HIP ARTHROPLASTY ANTERIOR APPROACH;  Surgeon: Mcarthur Rossetti, MD;  Location: [facility];  Service: Orthopedics;  Laterality: Left;  . TOTAL KNEE ARTHROPLASTY Left 01/03/2020   Procedure: LEFT TOTAL KNEE ARTHROPLASTY;  Surgeon: Mcarthur Rossetti, MD;  Location: [facility];  Service: Orthopedics;  Laterality: Left;  needs RNFA    There were no vitals filed for this visit.   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    Pertinent History L TKA 01/03/20, L THA, L shoulder scope    Limitations  Sitting;Standing    Patient Stated Goals "no pain - walk comfortable w/o limp & be normal"    Currently in Pain? Yes    Pain Score 2    2.5/10   Pain Location Knee    Pain Orientation Left    Pain Descriptors / Indicators Tightness;Sore    Pain Type Acute pain;Surgical pain    Pain Frequency Intermittent                             [facility] Adult PT Treatment/Exercise - 03/30/20 0932      Transfers   Transfers Floor to Transfer    Floor to Transfer 5: Supervision;With upper extremity assist    Floor to Transfer Details (indicate cue type and reason) Instructed pt in 1/2 kneel position on R knee including transition to/from standing as alternative to kneeling for job tasks that require him to normally get down on his knees    Comments Provided verbal instruction and demonstration in transfer technique via 1/2 kneel to allow him to get down on a "creeper" for work tasks.      Knee/Hip Exercises: Stretches   Passive Hamstring Stretch Left;2 reps;30 seconds    Passive Hamstring Stretch Limitations standing hip hinge with foot  on stool - cues to maintain neutral spine and knee extension, avoiding bouncing in stretch position    Quad Stretch Left;2 reps;30 seconds    Quad Stretch Limitations standing with foot propped behind on edge of mat table; hand on back of chair for UE support/balance    Gastroc Stretch Left;2 reps;30 seconds    Gastroc Stretch Limitations toes on baseboard; also verbally reviewed runner stretch & negative heel off edge of step      Knee/Hip Exercises: Aerobic   Recumbent Bike L5 x 6 min      Knee/Hip Exercises: Machines for Strengthening   Cybex Knee Extension B con/L ecc 20# 1 x 12   machine set to help push into flexion stretch at end of eccentric motion   Cybex Knee Flexion B con/L ecc 20# 1 x 12   machine set to help push into extension stretch at end of eccentric motion     Modalities   Modalities Vasopneumatic      Vasopneumatic   Number  Minutes Vasopneumatic  10 minutes    Vasopnuematic Location  Knee    Vasopneumatic Pressure High    Vasopneumatic Temperature  34                    PT Short Term Goals - 02/17/20 0960      PT SHORT TERM GOAL #1   Title Patient will be independent with initial HEP    Status Achieved   02/10/20     PT SHORT TERM GOAL #2   Title Patient will demonstrate L knee AROM >/= 5-90 dg to allow for improved gait pattern    Status Achieved   02/17/20 - L knee AROM 5-98            PT Long Term Goals - 03/27/20 1126      PT LONG TERM GOAL #1   Title Patient will be independent with ongoing/advanced HEP for self-management at home    Status Partially Met   03/09/20: met for current HEP     PT LONG TERM GOAL #2   Title Patient will demonstrate L knee AROM >/= 2-115 dg to allow for normal gait and stair mechanics   Ext 5 and Flex 111 (03/27/2020)   Status Partially Met      PT LONG TERM GOAL #3   Title Patient will demonstrate improved L LE strength to >/= 4+/5 for improved stability and ease of mobility    Status Achieved   03/27/2020     PT LONG TERM GOAL #4   Title Patient will ambulate with normal gait pattern w/o AD    Status Partially Met   03/27/20: pt continues to demonstrate intermittent limp     PT LONG TERM GOAL #5   Title Patient will negotiate stairs reciprocally with normal step pattern w/o limitation due to L knee pain or weakness    Status On-going   03/27/20: shows improvement in ablility to ascend/descend reciprocally with increased concentration to reduce hip substitution                Plan - 03/30/20 1024    Clinical Impression Statement [name] reports the MD is permitting him to return to work on light duty 8 hrs/day x 2 weeks as of next week and notes some concerns about how he will manage his stiffness/tightness while at work as well as some of the positions he needs to be in to complete his job. Provided instruction standing versions  of LE stretches  for HS, quad and gastroc to allow for performance as needed t/o his workday. 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 His current POC will expire next week, therefore will plan to assess need for recert vs readiness for transition to HEP as of next visit.    Comorbidities Multi-joint OA, L THA 2016, L shoulder scope, lumbar DDD    Rehab Potential Good    PT Frequency 2x / week    PT Duration 4 weeks    PT Treatment/Interventions ADLs/Self Care Home Management;Cryotherapy;Electrical Stimulation;Iontophoresis 24m/ml Dexamethasone;Moist Heat;DME Instruction;Gait training;Stair training;Functional mobility training;Therapeutic activities;Therapeutic exercise;Balance training;Neuromuscular re-education;Patient/family education;Manual techniques;Passive range of motion;Dry needling;Taping;Vasopneumatic Device;Joint Manipulations    PT Next Visit Plan probable recert to continue to work on L Knee ROM with joint mobilization or muscle stretching for flexion and extension along with gait mechanics and stair navigation    PT Home Exercise Plan HH PT HEP: standing heel raise, squat, march, seated hip abduction, LAQ, supine SAQ, heel slide, quad set; MedBridge Access Codes: [email or web address] (02-08-22; [email or web address] (2/8); [id number](2/11); KKFDLZHA (2/18);FDPYHJMF(3/11)    Consulted and Agree with Plan of Care Patient           Patient will benefit from skilled therapeutic intervention in order to improve the following deficits and impairments:  Decreased activity tolerance,Decreased balance,Decreased endurance,Decreased knowledge of precautions,Decreased knowledge of use of DME,Decreased mobility,Decreased range of motion,Decreased safety awareness,Decreased scar mobility,Decreased strength,Difficulty  walking,Increased edema,Increased fascial restricitons,Increased muscle spasms,Impaired perceived functional ability,Impaired flexibility,Improper body mechanics,Postural dysfunction,Pain  Visit Diagnosis: Stiffness of left knee, not elsewhere classified  Acute pain of left knee  Other abnormalities of gait and mobility  Difficulty in walking, not elsewhere classified  Other symptoms and signs involving the musculoskeletal system  Localized edema     Problem List Patient Active Problem List   Diagnosis Date Noted  . Status post total left knee replacement 01/03/2020  . Unilateral primary osteoarthritis, left knee 03/16/2019  . Osteoarthritis of left hip 07/07/2014  . Status post total replacement of left hip 07/07/2014    JPercival Spanish PT, MPT 03/30/2020, 1:44 PM  [facility] [place] [address]Phone: [phone]  Fax:  [phone] Name: HVAYDEN WEINANDMRN: [id number] of Birth: 5May 26, 1962

## 2020-03-30 NOTE — Patient Instructions (Signed)
   Access Code: [email or web address] URL: [email or web address] Date: 03/30/2020 Prepared by: Glenetta Hew  Exercises Quadricep Stretch with Chair and Counter Support - 2-3 x daily - 7 x weekly - 3 reps - 30 sec hold Standing Hamstring Stretch with Step - 2-3 x daily - 7 x weekly - 3 reps - 30 sec hold Gastroc Stretch with Foot at Wall - 2-3 x daily - 7 x weekly - 3 reps - 30 sec hold

## 2020-04-03 ENCOUNTER — Other Ambulatory Visit: Payer: Self-pay

## 2020-04-03 ENCOUNTER — Encounter: Payer: Self-pay | Admitting: Physical Therapy

## 2020-04-03 ENCOUNTER — Ambulatory Visit: Payer: BC Managed Care – PPO | Admitting: Physical Therapy

## 2020-04-03 DIAGNOSIS — R262 Difficulty in walking, not elsewhere classified: Secondary | ICD-10-CM

## 2020-04-03 DIAGNOSIS — M25562 Pain in left knee: Secondary | ICD-10-CM

## 2020-04-03 DIAGNOSIS — R2689 Other abnormalities of gait and mobility: Secondary | ICD-10-CM

## 2020-04-03 DIAGNOSIS — M25662 Stiffness of left knee, not elsewhere classified: Secondary | ICD-10-CM

## 2020-04-03 DIAGNOSIS — R29898 Other symptoms and signs involving the musculoskeletal system: Secondary | ICD-10-CM

## 2020-04-03 DIAGNOSIS — R600 Localized edema: Secondary | ICD-10-CM

## 2020-04-03 NOTE — Therapy (Signed)
[facility] [address]  [place], [place], [address] Phone: [phone]   Fax:  [phone]  Physical Therapy Treatment / Recert  Patient Details  Name: Philip [name] MRN: [id number] Date of Birth: [birth year] Referring Provider (PT): Mcarthur Rossetti, MD   Encounter Date: 04/03/2020   PT End of Session - 04/03/20 1528    Visit Number 22    Number of Visits 30    Date for PT Re-Evaluation 05/01/20    Authorization Type BCBS - VL=60 (PT/OT/ST combined)    PT Start Time 1528    PT Stop Time 1634    PT Time Calculation (min) 66 min    Activity Tolerance Patient tolerated treatment well    Behavior During Therapy [facility] for tasks assessed/performed           Past Medical History:  Diagnosis Date  . Arthritis   . Bulging lumbar disc   . Chest pain on exertion    past 2-3 weeks ---- 12/28/19-patient stated he pulled muscle in chest a long time ago but no recent "chest pain"  . Chronic diarrhea    several years    Past Surgical History:  Procedure Laterality Date  . fractured arm     as child  . SHOULDER ARTHROSCOPY    . TOTAL HIP ARTHROPLASTY Left 07/07/2014   Procedure: LEFT TOTAL HIP ARTHROPLASTY ANTERIOR APPROACH;  Surgeon: Mcarthur Rossetti, MD;  Location: [facility];  Service: Orthopedics;  Laterality: Left;  . TOTAL KNEE ARTHROPLASTY Left 01/03/2020   Procedure: LEFT TOTAL KNEE ARTHROPLASTY;  Surgeon: Mcarthur Rossetti, MD;  Location: [place];  Service: Orthopedics;  Laterality: Left;  needs RNFA    There were no vitals filed for this visit.   Subjective Assessment - 04/03/20 1534    Subjective Pt reports when he attempted to return to work yesterday, they were unable to accommodate the light duty restrictions set up by the MD so now he is out of work until 04/16/20 at which time he will go back at normal capacity. He notes pain is mild today but feels like there is more tightness today.     Pertinent History L TKA 01/03/20, L THA, L shoulder scope    Limitations Sitting;Standing    Patient Stated Goals "no pain - walk comfortable w/o limp & be normal"    Currently in Pain? Yes    Pain Score 2    1.5-2/10   Pain Location Knee    Pain Orientation Left    Pain Descriptors / Indicators Tightness;Sore              [facility] PT Assessment - 04/03/20 1528      Assessment   Medical Diagnosis L TKA    Referring Provider (PT) Mcarthur Rossetti, MD    Onset Date/Surgical Date 01/03/20    Next MD Visit 06/27/20      Observation/Other Assessments   Focus on Therapeutic Outcomes (FOTO)  Knee: FS = 69 (54 point increase since eval)      AROM   Left Knee Extension 4   12 in LAQ   Left Knee Flexion 111      Strength   Right Hip Flexion 5/5    Right Hip Extension 5/5    Right Hip ABduction 5/5    Right Hip ADduction 5/5    Left Hip Flexion 5/5    Left Hip Extension 5/5    Left Hip ABduction 5/5  Left Hip ADduction 5/5    Right Knee Flexion 5/5    Right Knee Extension 5/5   limited eccentric control   Left Knee Flexion 5/5    Left Knee Extension 5/5   limited eccentric control   Right Ankle Dorsiflexion 5/5    Left Ankle Dorsiflexion 5/5                         [facility] Adult PT Treatment/Exercise - 04/03/20 1528      Ambulation/Gait   Ambulation/Gait Assistance 7: Independent    Assistive device None    Gait Pattern Step-through pattern;Decreased arm swing - right;Decreased arm swing - left;Lateral trunk lean to left;Trunk flexed    Stairs Assistance 6: Modified independent (Device/Increase time)    Stairs Assistance Details (indicate cue type and reason) cues to decrease pull on railing    Stair Management Technique One rail Right;Alternating pattern;Forwards    Number of Stairs 12   2 sets   Height of Stairs 7    Gait Comments Limited eccentric control bilaterally on stair descent but pelvis now more level with less Trendelenburg hip drop apparent       Exercises   Exercises Knee/Hip      Knee/Hip Exercises: Aerobic   Recumbent Bike L5 x 6 min      Modalities   Modalities Vasopneumatic      Vasopneumatic   Number Minutes Vasopneumatic  15 minutes    Vasopnuematic Location  Knee    Vasopneumatic Pressure High    Vasopneumatic Temperature  34      Manual Therapy   Manual Therapy Joint mobilization    Joint Mobilization L A/P Femrotibial Glide Grade III and IV to facilitate EXT; L A/P Tibiofemoral glide grades III and IV in hooklying and Gapping with Flexion in sitting to facilitate FLEX                    PT Short Term Goals - 02/17/20 4327      PT SHORT TERM GOAL #1   Title Patient will be independent with initial HEP    Status Achieved   02/10/20     PT SHORT TERM GOAL #2   Title Patient will demonstrate L knee AROM >/= 5-90 dg to allow for improved gait pattern    Status Achieved   02/17/20 - L knee AROM 5-98            PT Long Term Goals - 04/03/20 1542      PT LONG TERM GOAL #1   Title Patient will be independent with ongoing/advanced HEP for self-management at home    Status Partially Met   04/03/20: met for current HEP   Target Date 05/01/20      PT LONG TERM GOAL #2   Title Patient will demonstrate L knee AROM >/= 2-115 dg to allow for normal gait and stair mechanics    Status Partially Met   04/03/20: L knee AROM 4-111 after joint mobs   Target Date 05/01/20      PT LONG TERM GOAL #3   Title Patient will demonstrate improved L LE strength to >/= 4+/5 for improved stability and ease of mobility    Status Achieved   03/27/2020     PT LONG TERM GOAL #4   Title Patient will ambulate with normal gait pattern w/o AD    Status Partially Met   03/27/20: pt continues to demonstrate intermittent limp  Target Date 05/01/20      PT LONG TERM GOAL #5   Title Patient will negotiate stairs reciprocally with normal step pattern w/o limitation due to L knee pain or weakness    Status On-going   04/03/20:  shows improvement in ablility to ascend/descend reciprocally with increased concentration to reduce hip substitution but continues limited eccentric control   Target Date 05/01/20                 Plan - 04/03/20 1619    Clinical Impression Statement Philip [name] reports he was unable to start back to work as planned yesterday as his employer was unable to accommodate the light duty restrictions, so now he will be returning to work on 04/16/20. His primary complaint presently is the ongoing stiffness and tightness in his L leg and knee which continues to limit his ROM as well as impact his walking tolerance and stair negotiation. His L knee AROM continues to progress slowly, currently 4-111, with maximal ROM only achieved following manual joint mobilization and increased soft tissue restriction returning. He has previously benefitted from DN to his quads and HS and would likely benefit from further DN to address current deficits. 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 All STGs have been met but majority of LTGs only partially met or ongoing. Given ongoing ROM, muscle tension/tightness and eccentric quad strength deficits impacting his ability to return to work, will recommend recert for additional 2x/wk for 2 weeks until he returns to work, then likely 1x/wk or PRN for 2 additional weeks once he is back at work.    Personal Factors and Comorbidities Comorbidity 3+;Time since onset of injury/illness/exacerbation;Fitness    Comorbidities Multi-joint OA, L THA 2016, L shoulder scope, lumbar DDD    Examination-Activity Limitations Locomotion Level;Squat;Stairs;Transfers    Examination-Participation Restrictions Community  Activity;Occupation    Rehab Potential Good    PT Frequency 2x / week   x 2 weeks, then likely reduced to 1x/wk or PRN x 2 weeks   PT Duration 4 weeks    PT Treatment/Interventions ADLs/Self Care Home Management;Cryotherapy;Electrical Stimulation;Iontophoresis 27m/ml Dexamethasone;Moist Heat;DME Instruction;Gait training;Stair training;Functional mobility training;Therapeutic activities;Therapeutic exercise;Balance training;Neuromuscular re-education;Patient/family education;Manual techniques;Passive range of motion;Dry needling;Taping;Vasopneumatic Device;Joint Manipulations    PT Next Visit Plan work simulation (1/2 kneel transition on R and simulated "creeper" transfer/floor transfer); eccentric quad strengthening for stair navigation; work on L Knee ROM with joint mobilization or muscle stretching for flexion and extension; gait posture and mechanics    PT Home Exercise Plan HH PT HEP: standing heel raise, squat, march, seated hip abduction, LAQ, supine SAQ, heel slide, quad set; MedBridge Access Codes: [id number] (02/18/2022; [id number] (2/8); [id number](2/11); KKFDLZHA (2/18);FDPYHJMF(3/11)    Consulted and Agree with Plan of Care Patient           Patient will benefit from skilled therapeutic intervention in order to improve the following deficits and impairments:  Decreased activity tolerance,Decreased balance,Decreased endurance,Decreased knowledge of precautions,Decreased knowledge of use of DME,Decreased mobility,Decreased range of motion,Decreased safety awareness,Decreased scar mobility,Decreased strength,Difficulty walking,Increased edema,Increased fascial restricitons,Increased muscle spasms,Impaired perceived functional ability,Impaired flexibility,Improper body mechanics,Postural dysfunction,Pain  Visit Diagnosis: Stiffness of left knee, not elsewhere classified  Acute pain of left knee  Other abnormalities of gait and mobility  Difficulty in walking, not elsewhere classified  Other  symptoms and signs involving the musculoskeletal system  Localized edema     Problem List Patient Active Problem List   Diagnosis Date Noted  . Status post total left knee replacement 01/03/2020  . Unilateral primary osteoarthritis, left knee 03/16/2019  . Osteoarthritis of left hip 07/07/2014  . Status post total replacement of left hip 07/07/2014    Percival Spanish, PT, MPT 04/03/2020, 6:01 PM  [facility] [address]  [place], [place], [address] Phone: [phone]   Fax:  [phone]  Name: Philip [name] MRN: [id number] Date of Birth: 17-Apr-1960

## 2020-04-06 ENCOUNTER — Ambulatory Visit: Payer: BC Managed Care – PPO

## 2020-04-09 ENCOUNTER — Other Ambulatory Visit: Payer: Self-pay

## 2020-04-09 ENCOUNTER — Encounter: Payer: Self-pay | Admitting: Physical Therapy

## 2020-04-09 ENCOUNTER — Ambulatory Visit: Payer: BC Managed Care – PPO | Attending: Orthopaedic Surgery | Admitting: Physical Therapy

## 2020-04-09 DIAGNOSIS — R262 Difficulty in walking, not elsewhere classified: Secondary | ICD-10-CM | POA: Diagnosis present

## 2020-04-09 DIAGNOSIS — R600 Localized edema: Secondary | ICD-10-CM | POA: Insufficient documentation

## 2020-04-09 DIAGNOSIS — M25562 Pain in left knee: Secondary | ICD-10-CM | POA: Insufficient documentation

## 2020-04-09 DIAGNOSIS — R2689 Other abnormalities of gait and mobility: Secondary | ICD-10-CM | POA: Diagnosis present

## 2020-04-09 DIAGNOSIS — M25662 Stiffness of left knee, not elsewhere classified: Secondary | ICD-10-CM | POA: Diagnosis not present

## 2020-04-09 DIAGNOSIS — R29898 Other symptoms and signs involving the musculoskeletal system: Secondary | ICD-10-CM | POA: Insufficient documentation

## 2020-04-09 NOTE — Therapy (Signed)
[facility] [address]  [place], [place], [address] Phone: [phone]   Fax:  [phone]  Physical Therapy Treatment  Patient Details  Name: Philip [name] MRN: [id number] Date of Birth: 11/16/1960 Referring Provider (PT): Mcarthur Rossetti, MD   Encounter Date: 04/09/2020   PT End of Session - 04/09/20 0907    Visit Number 23    Number of Visits 30    Date for PT Re-Evaluation 05/01/20    Authorization Type BCBS - VL=60 (PT/OT/ST combined)    PT Start Time 0815    PT Stop Time 0917    PT Time Calculation (min) 62 min    Activity Tolerance Patient tolerated treatment well    Behavior During Therapy [facility] for tasks assessed/performed           Past Medical History:  Diagnosis Date  . Arthritis   . Bulging lumbar disc   . Chest pain on exertion    past 2-3 weeks ---- 12/28/19-patient stated he pulled muscle in chest a long time ago but no recent "chest pain"  . Chronic diarrhea    several years    Past Surgical History:  Procedure Laterality Date  . fractured arm     as child  . SHOULDER ARTHROSCOPY    . TOTAL HIP ARTHROPLASTY Left 07/07/2014   Procedure: LEFT TOTAL HIP ARTHROPLASTY ANTERIOR APPROACH;  Surgeon: Mcarthur Rossetti, MD;  Location: [facility];  Service: Orthopedics;  Laterality: Left;  . TOTAL KNEE ARTHROPLASTY Left 01/03/2020   Procedure: LEFT TOTAL KNEE ARTHROPLASTY;  Surgeon: Mcarthur Rossetti, MD;  Location: [facility];  Service: Orthopedics;  Laterality: Left;  needs RNFA    There were no vitals filed for this visit.   Subjective Assessment - 04/09/20 0817    Subjective Pt. reports his L knee is doing "pretty good." He has 2/10 constant pain and stiffness following waking up or after prolonged sitting. This is able to loosen up after standing or moving for around 3 minutes.    Pertinent History L TKA 01/03/20, L THA, L shoulder scope    Limitations Sitting;Standing    Patient  Stated Goals "no pain - walk comfortable w/o limp & be normal"    Currently in Pain? Yes    Pain Score 2     Pain Location Knee    Pain Orientation Left                             [facility] Adult PT Treatment/Exercise - 04/09/20 0815      Transfers   Transfers Floor to Transfer    Floor to Transfer 5: Supervision;With upper extremity assist    Floor to Transfer Details (indicate cue type and reason) Instructed pt in 1/2 kneel position on R knee including transition to/from standing as alternative to kneeling for job tasks that require him to normally get down on his knees; 1/2 kneel to supine, using two hands facing support to get to 1/2 kneel and then from there to supine.      Exercises   Exercises Knee/Hip      Knee/Hip Exercises: Stretches   Passive Hamstring Stretch Left;2 reps;30 seconds    Passive Hamstring Stretch Limitations Supine with strap assist    Quad Stretch Left;2 reps;30 seconds    Quad Stretch Limitations Prone with strap assist      Knee/Hip Exercises: Aerobic   Recumbent Bike L5  x 6 min      Knee/Hip Exercises: Machines for Strengthening   Cybex Knee Extension B con/L ecc 20# 1 x 12; 25# 1 x 12   machine set to help push into flexion stretch at end of eccentric motion; working more eccentric strengthening for quads   Cybex Knee Flexion B con/L ecc 20# 1 x 12; #25 1 x 12   machine set to help push into extension stretch at end of eccentric motion   Cybex Leg Press 15# B LE 1 x 12; #20 1 x 12      Knee/Hip Exercises: Standing   Step Down 2 sets;15 reps;Hand Hold: 1;Step Height: 8"    Step Down Limitations --   Step overs forward eccentric lowering with toe tap;     Vasopneumatic   Number Minutes Vasopneumatic  15 minutes    Vasopnuematic Location  Knee    Vasopneumatic Pressure High    Vasopneumatic Temperature  34      Manual Therapy   Manual Therapy Joint mobilization    Joint Mobilization L A/P Femrotibial Glide Grade III and IV to  facilitate EXT; L A/P Tibiofemoral glide grades III and IV in hooklying and Gapping with Flexion in supine to facilitate FLEX; L Patellar Glides Medial and lateral for patellar mobility grades III-IV; L patellar superior glides for ext and inferior glides for flexion grades III-IV.                  PT Education - 04/09/20 0906    Education Details Education provided for standing to 1/2 kneel transfers and 1/2 kneel to supine for work related activities.    Person(s) Educated Patient    Methods Explanation;Tactile cues;Demonstration;Verbal cues    Comprehension Verbalized understanding;Returned demonstration;Verbal cues required;Tactile cues required            PT Short Term Goals - 02/17/20 0928      PT SHORT TERM GOAL #1   Title Patient will be independent with initial HEP    Status Achieved   02/10/20     PT SHORT TERM GOAL #2   Title Patient will demonstrate L knee AROM >/= 5-90 dg to allow for improved gait pattern    Status Achieved   02/17/20 - L knee AROM 5-98            PT Long Term Goals - 04/03/20 1542      PT LONG TERM GOAL #1   Title Patient will be independent with ongoing/advanced HEP for self-management at home    Status Partially Met   04/03/20: met for current HEP   Target Date 05/01/20      PT LONG TERM GOAL #2   Title Patient will demonstrate L knee AROM >/= 2-115 dg to allow for normal gait and stair mechanics    Status Partially Met   04/03/20: L knee AROM 4-111 after joint mobs   Target Date 05/01/20      PT LONG TERM GOAL #3   Title Patient will demonstrate improved L LE strength to >/= 4+/5 for improved stability and ease of mobility    Status Achieved   03/27/2020     PT LONG TERM GOAL #4   Title Patient will ambulate with normal gait pattern w/o AD    Status Partially Met   03/27/20: pt continues to demonstrate intermittent limp   Target Date 05/01/20      PT LONG TERM GOAL #5   Title Patient will negotiate stairs reciprocally  with  normal step pattern w/o limitation due to L knee pain or weakness    Status On-going   04/03/20: shows improvement in ablility to ascend/descend reciprocally with increased concentration to reduce hip substitution but continues limited eccentric control   Target Date 05/01/20                 Plan - 04/09/20 0907    Clinical Impression Statement Payden reports stiffness in L knee following sleep or prolonged sitting. He can ease the stiffness with about 3 minutes of standing or walking. He reports no issues with the HEP. He presents with tightness in his L quads and hamstrings and his L knee is hypomobile. We educated and taught [place]el floor transfers from standing to  kneel to supine for work related activities. Ziaire feels comfortable when support is available on his right side or in front of him to help with transfers. He returns to work on 4/11. Pt. will continue to benefit from skilled PT to improve gait and stair mobility, L knee ROM, and L quad strengthening for return to work and to improve function for ADL's.    Personal Factors and Comorbidities Comorbidity 3+;Time since onset of injury/illness/exacerbation;Fitness    Comorbidities Multi-joint OA, L THA 2016, L shoulder scope, lumbar DDD    Examination-Activity Limitations Locomotion Level;Squat;Stairs;Transfers    Examination-Participation Restrictions Community Activity;Occupation    Rehab Potential Good    PT Frequency 2x / week   x 2 weeks, then likely reduced to 1x/wk or PRN x 2 weeks   PT Duration 4 weeks    PT Treatment/Interventions ADLs/Self Care Home Management;Cryotherapy;Electrical Stimulation;Iontophoresis 7m/ml Dexamethasone;Moist Heat;DME Instruction;Gait training;Stair training;Functional mobility training;Therapeutic activities;Therapeutic exercise;Balance training;Neuromuscular re-education;Patient/family education;Manual techniques;Passive range of motion;Dry needling;Taping;Vasopneumatic Device;Joint Manipulations     PT Next Visit Plan Assess L knee ROM; Assess HEP and determine what's most beneficial in case of discharge; Continued  work simulation (1/2 kneel transition on R and simulated "Chartered loss adjustertransfer); Assess stair navigation; increase difficulty for eccentric quad strengthening; work on L Knee ROM with joint mobilization or muscle stretching for flexion and extension; gait posture and mechanics    PT Home Exercise Plan HH PT HEP: standing heel raise, squat, march, seated hip abduction, LAQ, supine SAQ, heel slide, quad set; MedBridge Access Codes: [id number] (2022/02/01; [email or web address] (2/8); [id number](2/11); KKFDLZHA (2/18);FDPYHJMF(3/11)    Consulted and Agree with Plan of Care Patient           Patient will benefit from skilled therapeutic intervention in order to improve the following deficits and impairments:  Decreased activity tolerance,Decreased balance,Decreased endurance,Decreased knowledge of precautions,Decreased knowledge of use of DME,Decreased mobility,Decreased range of motion,Decreased safety awareness,Decreased scar mobility,Decreased strength,Difficulty walking,Increased edema,Increased fascial restricitons,Increased muscle spasms,Impaired perceived functional ability,Impaired flexibility,Improper body mechanics,Postural dysfunction,Pain  Visit Diagnosis: Stiffness of left knee, not elsewhere classified  Acute pain of left knee  Other abnormalities of gait and mobility  Difficulty in walking, not elsewhere classified  Other symptoms and signs involving the musculoskeletal system  Localized edema     Problem List Patient Active Problem List   Diagnosis Date Noted  . Status post total left knee replacement 01/03/2020  . Unilateral primary osteoarthritis, left knee 03/16/2019  . Osteoarthritis of left hip 07/07/2014  . Status post total replacement of left hip 07/07/2014    SNewman NickelsSPT 04/09/2020, 9:28 AM  [facility] [place] [address]Phone: [phone]  Fax:  [phone]  Name: EDWING FIGLEY MRN: [id number] Date of Birth: [birth year]

## 2020-04-11 ENCOUNTER — Encounter: Payer: Self-pay | Admitting: Physical Therapy

## 2020-04-11 ENCOUNTER — Other Ambulatory Visit: Payer: Self-pay

## 2020-04-11 ENCOUNTER — Ambulatory Visit: Payer: BC Managed Care – PPO | Admitting: Physical Therapy

## 2020-04-11 DIAGNOSIS — M25662 Stiffness of left knee, not elsewhere classified: Secondary | ICD-10-CM | POA: Diagnosis not present

## 2020-04-11 DIAGNOSIS — M25562 Pain in left knee: Secondary | ICD-10-CM

## 2020-04-11 DIAGNOSIS — R29898 Other symptoms and signs involving the musculoskeletal system: Secondary | ICD-10-CM

## 2020-04-11 DIAGNOSIS — R600 Localized edema: Secondary | ICD-10-CM

## 2020-04-11 DIAGNOSIS — R2689 Other abnormalities of gait and mobility: Secondary | ICD-10-CM

## 2020-04-11 DIAGNOSIS — R262 Difficulty in walking, not elsewhere classified: Secondary | ICD-10-CM

## 2020-04-11 NOTE — Therapy (Addendum)
[facility] [address]  [place], [place], [address] Phone: [phone]   Fax:  [phone]  Physical Therapy Treatment / Progress Note / Discharge Summary  Patient Details  Name: Philip [name] MRN: [id number] Date of Birth: [birth year] Referring Provider (PT): Mcarthur Rossetti, MD   Encounter Date: 04/11/2020   PT End of Session - 04/11/20 0933    Visit Number 24    Number of Visits 30    Date for PT Re-Evaluation 05/01/20    Authorization Type [employer] - [id number] (PT/OT/ST combined)    PT Start Time 0933    PT Stop Time 1037    PT Time Calculation (min) 64 min    Activity Tolerance Patient tolerated treatment well    Behavior During Therapy [facility] for tasks assessed/performed           Past Medical History:  Diagnosis Date  . Arthritis   . Bulging lumbar disc   . Chest pain on exertion    past 2-3 weeks ---- 12/28/19-patient stated he pulled muscle in chest a long time ago but no recent "chest pain"  . Chronic diarrhea    several years    Past Surgical History:  Procedure Laterality Date  . fractured arm     as child  . SHOULDER ARTHROSCOPY    . TOTAL HIP ARTHROPLASTY Left 07/07/2014   Procedure: LEFT TOTAL HIP ARTHROPLASTY ANTERIOR APPROACH;  Surgeon: Mcarthur Rossetti, MD;  Location: [facility];  Service: Orthopedics;  Laterality: Left;  . TOTAL KNEE ARTHROPLASTY Left 01/03/2020   Procedure: LEFT TOTAL KNEE ARTHROPLASTY;  Surgeon: Mcarthur Rossetti, MD;  Location: [place];  Service: Orthopedics;  Laterality: Left;  needs RNFA    There were no vitals filed for this visit.   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 He still has stiffness in the morning and notes he  will still find himself limping if her tries to get going too fast.    Pertinent History L TKA 01/03/20, L THA, L shoulder scope    Limitations Sitting;Standing    Patient Stated Goals "no pain - walk comfortable w/o limp & be normal"    Currently in Pain? Yes    Pain Score 1     Pain Location Knee    Pain Orientation Left    Pain Descriptors / Indicators Tightness   "stiffness"   Pain Type Acute pain;Surgical pain    Pain Frequency Intermittent              [facility] PT Assessment - 04/11/20 0933      Assessment   Medical Diagnosis L TKA    Referring Provider (PT) Mcarthur Rossetti, MD    Onset Date/Surgical Date 01/03/20    Next MD Visit 06/27/20      Observation/Other Assessments   Focus on Therapeutic Outcomes (FOTO)  Knee: FS = 69 (54 point increase since eval)      AROM   Left Knee Extension 4   8 in LAQ   Left Knee Flexion 113      Ambulation/Gait   Ambulation/Gait Assistance 7: Independent    Gait Pattern Step-through pattern;Within Functional Limits  Stairs Assistance 6: Modified independent (Device/Increase time)    Stair Management Technique One rail Right;Alternating pattern;Forwards    Number of Stairs 12   2 sets   Height of Stairs 7    Gait Comments Intermittent limited eccentric control bilaterally on stair descent with hard landinfg on step but no instability and minimal to no Trendelenburg hip drop apparent                         [facility] Adult PT Treatment/Exercise - 04/11/20 0933      Exercises   Exercises Knee/Hip      Knee/Hip Exercises: Aerobic   Recumbent Bike L5 x 6 min      Modalities   Modalities Vasopneumatic      Vasopneumatic   Number Minutes Vasopneumatic  15 minutes    Vasopnuematic Location  Knee    Vasopneumatic Pressure High    Vasopneumatic Temperature  34      Manual Therapy   Manual Therapy Joint mobilization;Soft tissue mobilization    Joint Mobilization L A/P Femorotibial Glide Grade III and IV to  facilitate EXT +/- longitudinal distraction of L LE; L A/P Tibiofemoral glide grades III and IV in hooklying and Gapping with Flexion in supine to facilitate FLEX; L Patellar Glides Medial and lateral for patellar mobility grades III-IV; L patellar superior glides for ext and inferior glides for flexion grades III-IV.    Soft tissue mobilization STM/DTM to distal HS & quads along with proximal gastroc                  PT Education - 04/11/20 1014    Education Details PT progress to date. HEP verbal review with emphasis on priority stretches and exercise to continue with upon D/C. Info on 30-day hold process.    Person(s) Educated Patient    Methods Explanation    Comprehension Verbalized understanding            PT Short Term Goals - 02/17/20 0928      PT SHORT TERM GOAL #1   Title Patient will be independent with initial HEP    Status Achieved   02/10/20     PT SHORT TERM GOAL #2   Title Patient will demonstrate L knee AROM >/= 5-90 dg to allow for improved gait pattern    Status Achieved   02/17/20 - L knee AROM 5-98            PT Long Term Goals - 04/11/20 0942      PT LONG TERM GOAL #1   Title Patient will be independent with ongoing/advanced HEP for self-management at home    Status Achieved   04/03/20: met for current HEP     PT LONG TERM GOAL #2   Title Patient will demonstrate L knee AROM >/= 2-115 dg to allow for normal gait and stair mechanics    Status Not Met   05/01/20: L knee AROM 4-113 after joint mobs     PT LONG TERM GOAL #3   Title Patient will demonstrate improved L LE strength to >/= 4+/5 for improved stability and ease of mobility    Status Achieved   03/27/2020     PT LONG TERM GOAL #4   Title Patient will ambulate with normal gait pattern w/o AD    Status Achieved   04/11/20     PT LONG TERM GOAL #5   Title Patient will negotiate stairs reciprocally with normal step  pattern w/o limitation due to L knee pain or weakness    Status Achieved    04/11/20                Plan - 04/11/20 1025    Clinical Impression Statement Philip [name] has demonstrated good progress with PT s/p L TKA. He has successfully weaned from all AD with ambulation and stair navigation with normal gait pattern relative to LEs although still with fwd flexed posture at times due to his back - able to correct with cues to look up when walking. L knee AROM now 4-113 with pt feeling mostly limited by chronic muscle tightness in HS, quad and gastroc - pt aware of relevant stretches and positioning to continue to focus on with HEP to promote improved ROM. Overall LE strength now 5/5 on MMT with only mild limited eccentric quad control still evident - pt aware of HEP exercises to further improve eccentric quad control. We have reviewed techniques for mobility related to typical job tasks to avoid weightbearing on anterior L knee and pt feels that he will be able to incorporate these at work. All goals now met with exception of L knee ROM within 2 of expected flexion and extension ROM. Philip [name] will be returning to work as of Monday 04/16/20 and feels ready to transition to his HEP but would like to remain on hold in the event that issues arise as he returns to work.    Comorbidities Multi-joint OA, L THA 2016, L shoulder scope, lumbar DDD    Rehab Potential Good    PT Frequency --   x 2 weeks, then likely reduced to 1x/wk or PRN x 2 weeks   PT Treatment/Interventions ADLs/Self Care Home Management;Cryotherapy;Electrical Stimulation;Iontophoresis 56m/ml Dexamethasone;Moist Heat;DME Instruction;Gait training;Stair training;Functional mobility training;Therapeutic activities;Therapeutic exercise;Balance training;Neuromuscular re-education;Patient/family education;Manual techniques;Passive range of motion;Dry needling;Taping;Vasopneumatic Device;Joint Manipulations    PT Next Visit Plan transition to HEP + 30-day hold    PT Home Exercise Plan [facility] HEP: standing heel raise, squat, march,  seated hip abduction, LAQ, supine SAQ, heel slide, quad set; [employer] Access Codes: [id number] (21-Feb-2022; [id number] (2/8); [id number](2/11); [id number] (2/18);[id number](3/11)    Consulted and Agree with Plan of Care Patient           Patient will benefit from skilled therapeutic intervention in order to improve the following deficits and impairments:  Decreased activity tolerance,Decreased balance,Decreased endurance,Decreased knowledge of precautions,Decreased knowledge of use of DME,Decreased mobility,Decreased range of motion,Decreased safety awareness,Decreased scar mobility,Decreased strength,Difficulty walking,Increased edema,Increased fascial restricitons,Increased muscle spasms,Impaired perceived functional ability,Impaired flexibility,Improper body mechanics,Postural dysfunction,Pain  Visit Diagnosis: Stiffness of left knee, not elsewhere classified  Acute pain of left knee  Other abnormalities of gait and mobility  Difficulty in walking, not elsewhere classified  Other symptoms and signs involving the musculoskeletal system  Localized edema     Problem List Patient Active Problem List   Diagnosis Date Noted  . Status post total left knee replacement 01/03/2020  . Unilateral primary osteoarthritis, left knee 03/16/2019  . Osteoarthritis of left hip 07/07/2014  . Status post total replacement of left hip 07/07/2014    JPercival Spanish PT, MPT 04/11/2020, 10:50 AM  [facility] [place] [address]Phone: [phone]  Fax:  [phone] Name: Philip DENNERMRN: [id number] of Birth: [birth year] PHYSICAL THERAPY DISCHARGE SUMMARY  Visits from Start of Care: 24  Current functional level related to goals / functional  outcomes:   Refer to above clinical impression for status as of last visit on 04/11/2020. Patient was placed on hold for 30 days and has not needed to return to PT, therefore  will proceed with discharge from PT for this episode.   Remaining deficits:   As above.   Education / Equipment:   HEP  Plan: Patient agrees to discharge.  Patient goals were mostly met. Patient is being discharged due to being pleased with the current functional level.  ?????     Percival Spanish, PT, MPT 06/13/20, 9:50 AM  [facility] [address]  [place], [place], [address] Phone: [phone]   Fax:  [phone]

## 2020-06-27 ENCOUNTER — Encounter: Payer: Self-pay | Admitting: Orthopaedic Surgery

## 2020-06-27 ENCOUNTER — Ambulatory Visit: Payer: BC Managed Care – PPO | Admitting: Orthopaedic Surgery

## 2020-06-27 DIAGNOSIS — Z96652 Presence of left artificial knee joint: Secondary | ICD-10-CM | POA: Diagnosis not present

## 2020-06-27 NOTE — Progress Notes (Signed)
HPI: Mr. Ring returns now with 6.6 months status post left total knee arthroplasty.  He states he is overall doing well.  He does note some soreness femur region.  Still has some swelling at the time.  Has popping in the knee but no pain.  Notes his gait is somewhat still disturbed on the left side.  He has had no new injury to the knee.  Review of systems: See HPI otherwise negative or noncontributory  Physical exam: Left knee full extension flexion to 110 degrees.  No instability valgus varus stressing.  No abnormal warmth erythema.  Surgical incisions well-healed.  Left calf is supple and nontender.  Dorsiflexion plantarflexion right foot intact.  Ambulates without any assistive device.  Slight antalgic gait on the left.  Impression: Status post left total knee arthroplasty 01/03/2020  Plan: We will have him continue to work on range of motion strengthening the knee.  Also work on quad strengthening exercises are shown.  We will see him back in just 3 months at that time obtain AP lateral views of the left knee.  Questions were encouraged and answered.

## 2020-09-26 ENCOUNTER — Ambulatory Visit: Payer: BC Managed Care – PPO | Admitting: Orthopaedic Surgery

## 2020-10-04 ENCOUNTER — Ambulatory Visit: Payer: Self-pay

## 2020-10-04 ENCOUNTER — Ambulatory Visit: Payer: BC Managed Care – PPO | Admitting: Orthopaedic Surgery

## 2020-10-04 ENCOUNTER — Encounter: Payer: Self-pay | Admitting: Orthopaedic Surgery

## 2020-10-04 DIAGNOSIS — Z96652 Presence of left artificial knee joint: Secondary | ICD-10-CM | POA: Diagnosis not present

## 2020-10-04 NOTE — Progress Notes (Signed)
The patient is now 8 months status post a left total knee arthroplasty.  This was done with press-fit implants.  He reports that he is doing well overall.  She says sometimes the knee feels weird when he is down directly on his knee.  Occasionally he feels like it may be unstable but he gets over that quickly he states.  There is still just some mild swelling and warmth with his left knee but nothing worrisome.  His range of motion is full.  I did not feel any instability on my exam of that knee.  I did review the op note and the polyliner is only 9 mm thickness.  2 views left knee show well-seated total knee arthroplasty that is press-fit with no complicating features.  There is no evidence of loosening of the components.  I want him to continue to work on strengthening his knee.  I would like to see him back in 6 months for repeat 2 views of the left knee and repeat clinical exam.  All question concerns were answered and addressed.

## 2021-04-03 ENCOUNTER — Ambulatory Visit (INDEPENDENT_AMBULATORY_CARE_PROVIDER_SITE_OTHER): Payer: BC Managed Care – PPO

## 2021-04-03 ENCOUNTER — Ambulatory Visit (INDEPENDENT_AMBULATORY_CARE_PROVIDER_SITE_OTHER): Payer: BC Managed Care – PPO | Admitting: Orthopaedic Surgery

## 2021-04-03 ENCOUNTER — Encounter: Payer: Self-pay | Admitting: Orthopaedic Surgery

## 2021-04-03 DIAGNOSIS — Z96652 Presence of left artificial knee joint: Secondary | ICD-10-CM | POA: Diagnosis not present

## 2021-04-03 NOTE — Progress Notes (Signed)
The patient is a 61 year old gentleman well-known to me.  We replaced his left hip in 2016.  We replaced his left knee in December 2021.  He says both areas are doing well overall.  He did have an x-ray of his left hip and pelvis by his rheumatologist in June of last year.  It showed moderate arthritis of the right hip and there is a slight lucency to the lateral aspect of the shoulder of his left hip replacement documented by the radiologist.  However, the patient denies any hip pain at all.  I have seen the sclerotic lab before and they can suggest loosening the use of those patients are symptomatic.  I have plenty of patients that have that lucency that are asymptomatic and have no loosening.  He says his hip and knee on the left side are doing great. ? ?His left hip moves smoothly and fluidly with no pain at all.  He has good range of motion of his left knee as well.  Incisions of healed nicely.  His left knee is ligamentously stable. ? ?AP and lateral left knee shows a well-seated press-fit total knee arthroplasty with no complicating features. ? ?At this point follow-up can be as needed for joints unless he starts developing pain.  If he has any issues he will let us know. ?

## 2022-03-02 IMAGING — DX DG KNEE 1-2V PORT*L*
1 series · 2 of 2 positions shown · non-contrast
Comparison: 06/15/2019

CLINICAL DATA: Postop knee replacement

EXAM:
PORTABLE LEFT KNEE - 1-2 VIEW

[Series 1: knee · 0.14mm/px · 2 of 2 slices shown]
[im 1/2]
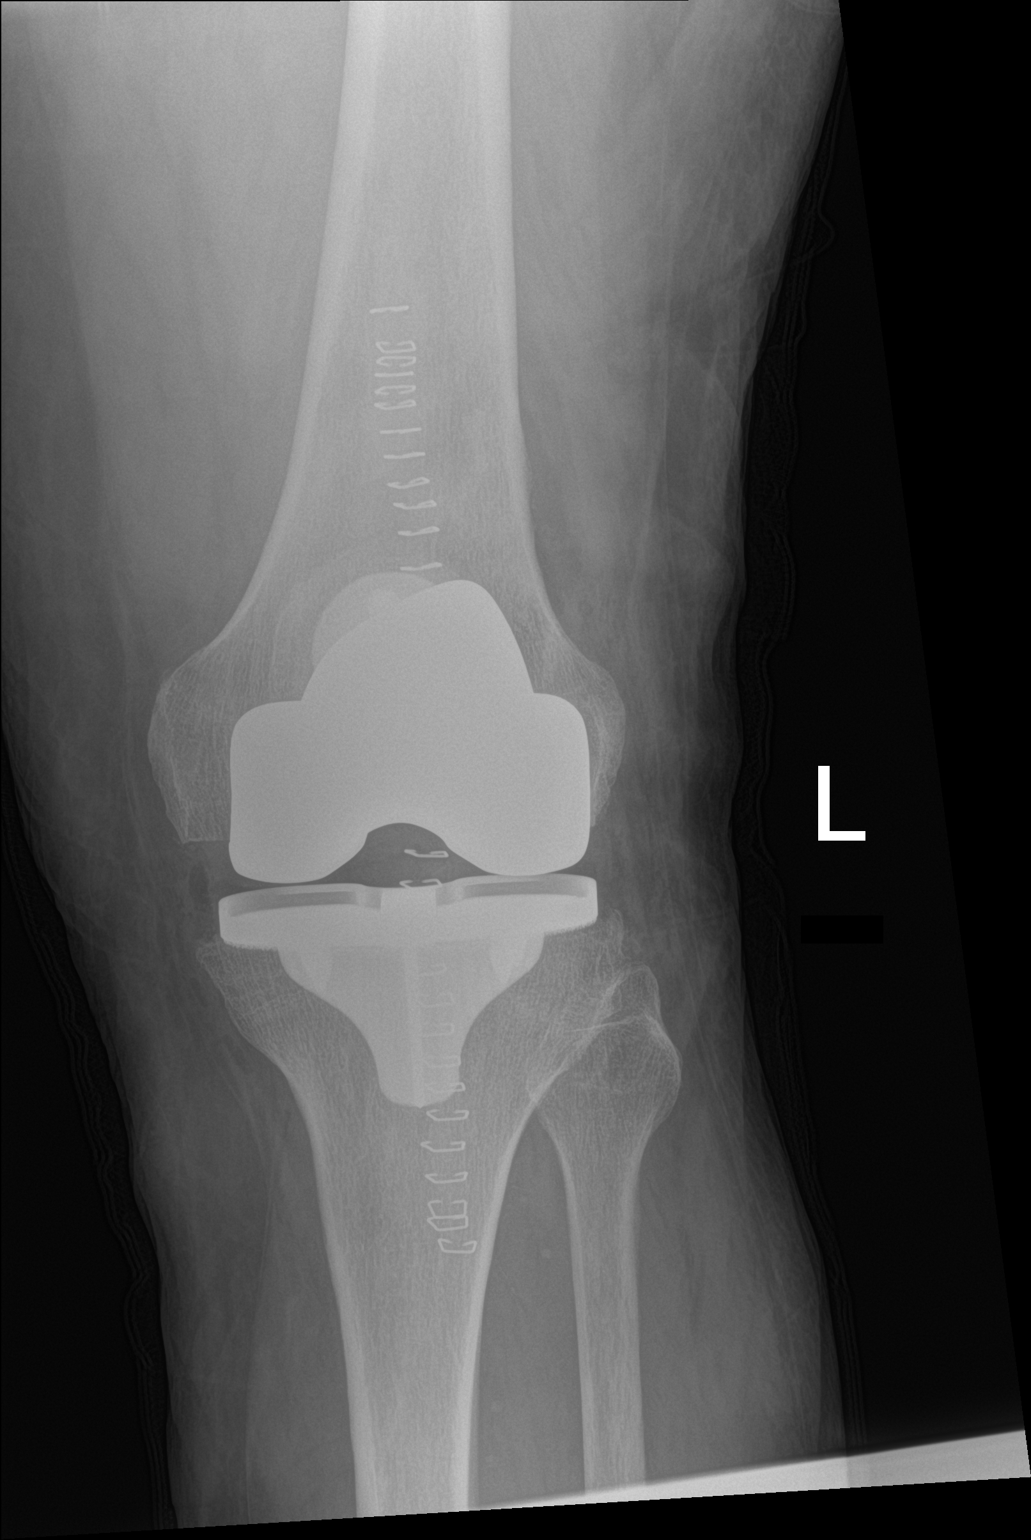
[im 2/2]
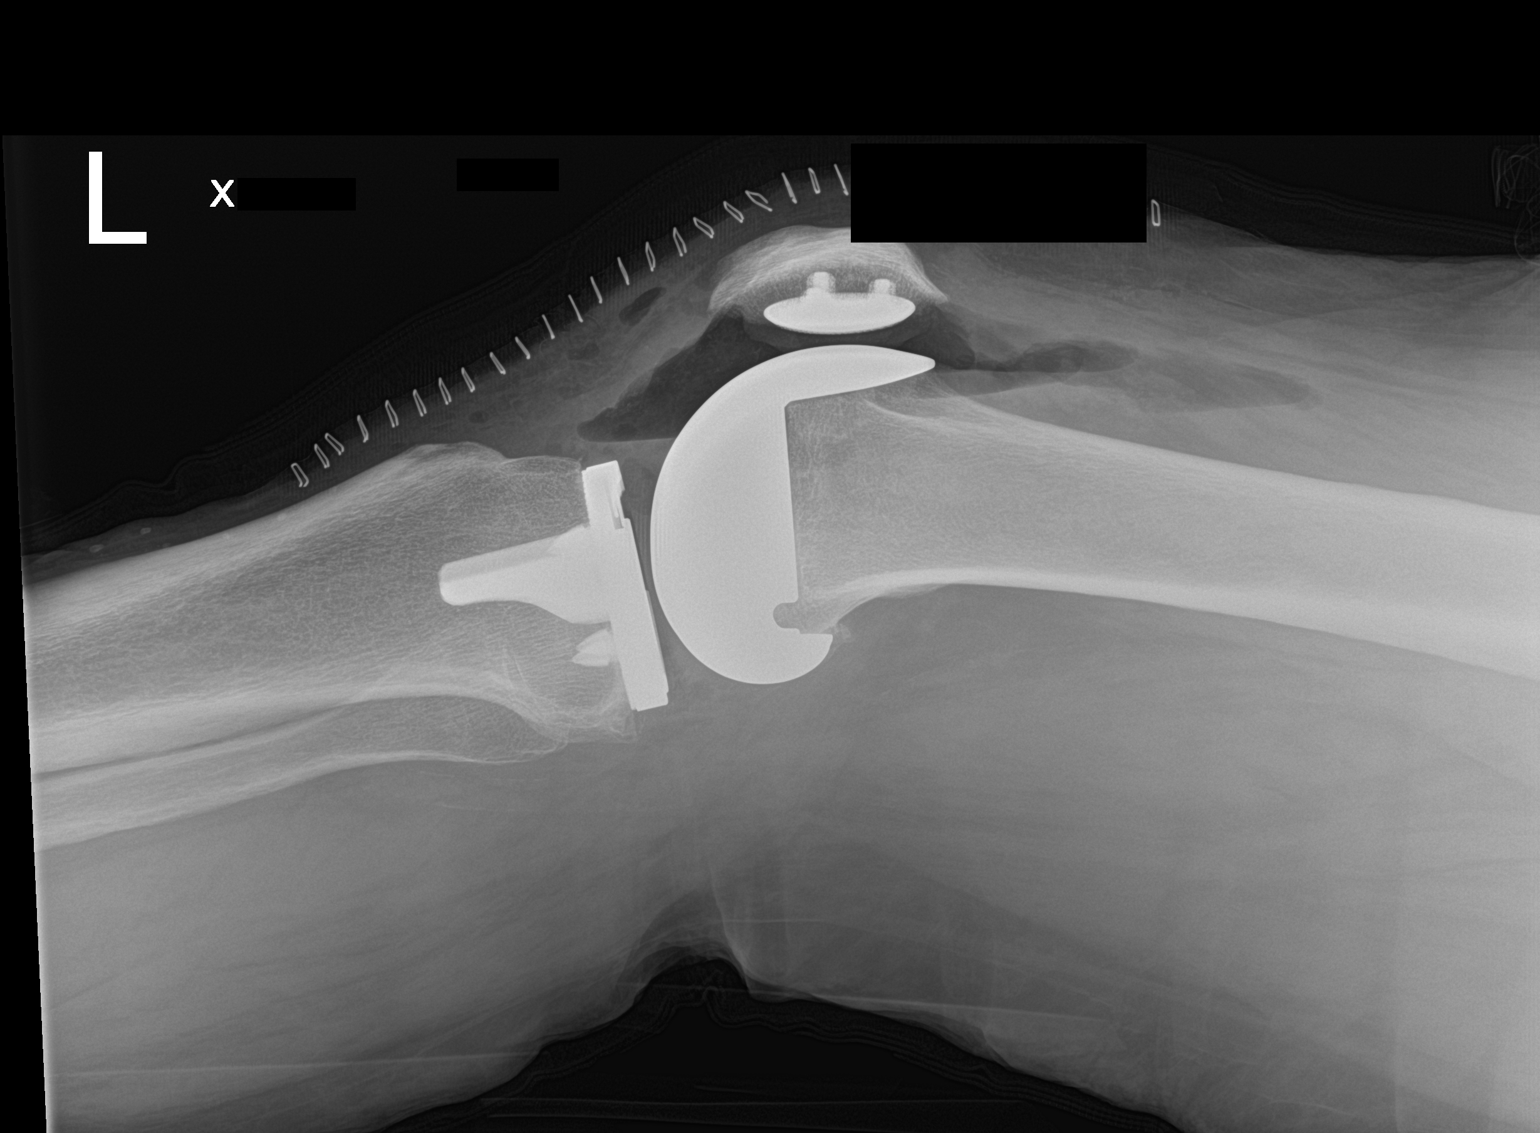

[2 of 2 positions shown; findings below may reference images not displayed]

FINDINGS: Two-view show total knee arthroplasty. Components appear well
positioned. No unexpected finding. Fluid and air in the joint as
expected.
IMPRESSION: Good appearance following total knee arthroplasty.
